# Patient Record
Sex: Male | Born: 1955
Health system: Southern US, Community
[De-identification: ages and names within clinical notes are randomized; demographics above are authoritative.]

## PROBLEM LIST (undated history)

## (undated) DIAGNOSIS — E538 Deficiency of other specified B group vitamins: Secondary | ICD-10-CM

## (undated) DIAGNOSIS — M1990 Unspecified osteoarthritis, unspecified site: Secondary | ICD-10-CM

## (undated) DIAGNOSIS — Z87442 Personal history of urinary calculi: Secondary | ICD-10-CM

## (undated) DIAGNOSIS — C801 Malignant (primary) neoplasm, unspecified: Secondary | ICD-10-CM

## (undated) DIAGNOSIS — E039 Hypothyroidism, unspecified: Secondary | ICD-10-CM

## (undated) DIAGNOSIS — E785 Hyperlipidemia, unspecified: Secondary | ICD-10-CM

## (undated) DIAGNOSIS — K5090 Crohn's disease, unspecified, without complications: Secondary | ICD-10-CM

## (undated) HISTORY — DX: Personal history of urinary calculi: Z87.442

## (undated) HISTORY — DX: Deficiency of other specified B group vitamins: E53.8

## (undated) HISTORY — DX: Crohn's disease, unspecified, without complications: K50.90

## (undated) HISTORY — PX: TONSILLECTOMY: SUR1361

---

## 1986-07-30 HISTORY — PX: SMALL INTESTINE SURGERY: SHX150

## 2002-11-16 ENCOUNTER — Encounter: Payer: Self-pay | Admitting: Orthopedic Surgery

## 2002-11-16 ENCOUNTER — Ambulatory Visit (HOSPITAL_COMMUNITY): Admission: RE | Admit: 2002-11-16 | Discharge: 2002-11-16 | Payer: Self-pay | Admitting: Orthopedic Surgery

## 2004-09-25 ENCOUNTER — Ambulatory Visit: Payer: Self-pay | Admitting: Internal Medicine

## 2004-10-02 ENCOUNTER — Ambulatory Visit (HOSPITAL_COMMUNITY): Admission: RE | Admit: 2004-10-02 | Discharge: 2004-10-02 | Payer: Self-pay | Admitting: Internal Medicine

## 2005-09-10 ENCOUNTER — Ambulatory Visit: Payer: Self-pay | Admitting: Internal Medicine

## 2006-10-03 ENCOUNTER — Ambulatory Visit (HOSPITAL_COMMUNITY): Admission: RE | Admit: 2006-10-03 | Discharge: 2006-10-03 | Payer: Self-pay | Admitting: Internal Medicine

## 2006-10-03 ENCOUNTER — Encounter (INDEPENDENT_AMBULATORY_CARE_PROVIDER_SITE_OTHER): Payer: Self-pay | Admitting: *Deleted

## 2006-10-03 ENCOUNTER — Ambulatory Visit: Payer: Self-pay | Admitting: Internal Medicine

## 2006-10-03 HISTORY — PX: COLONOSCOPY: SHX174

## 2007-07-31 HISTORY — PX: OTHER SURGICAL HISTORY: SHX169

## 2007-11-19 ENCOUNTER — Ambulatory Visit: Payer: Self-pay | Admitting: Internal Medicine

## 2008-09-01 ENCOUNTER — Ambulatory Visit: Payer: Self-pay | Admitting: Internal Medicine

## 2008-09-01 LAB — CONVERTED CEMR LAB
Basophils Absolute: 0.1 10*3/uL (ref 0.0–0.1)
Basophils Relative: 1 % (ref 0–1)
Calcium: 9.9 mg/dL (ref 8.4–10.5)
Creatinine, Ser: 1.13 mg/dL (ref 0.40–1.20)
Eosinophils Absolute: 0.1 10*3/uL (ref 0.0–0.7)
Eosinophils Relative: 1 % (ref 0–5)
HCT: 44.2 % (ref 36.0–46.0)
Lymphocytes Relative: 27 % (ref 12–46)
MCHC: 33.5 g/dL (ref 30.0–36.0)
MCV: 88.2 fL (ref 78.0–100.0)
Platelets: 269 10*3/uL (ref 150–400)
RDW: 15.7 % — ABNORMAL HIGH (ref 11.5–15.5)
Sodium: 140 meq/L (ref 135–145)
Vitamin B-12: 1216 pg/mL — ABNORMAL HIGH (ref 211–911)

## 2009-08-19 ENCOUNTER — Encounter (INDEPENDENT_AMBULATORY_CARE_PROVIDER_SITE_OTHER): Payer: Self-pay | Admitting: *Deleted

## 2009-09-30 DIAGNOSIS — G43909 Migraine, unspecified, not intractable, without status migrainosus: Secondary | ICD-10-CM | POA: Insufficient documentation

## 2009-09-30 DIAGNOSIS — K5090 Crohn's disease, unspecified, without complications: Secondary | ICD-10-CM | POA: Insufficient documentation

## 2009-10-03 ENCOUNTER — Encounter: Payer: Self-pay | Admitting: Gastroenterology

## 2009-10-03 ENCOUNTER — Ambulatory Visit: Payer: Self-pay | Admitting: Internal Medicine

## 2009-10-03 DIAGNOSIS — E638 Other specified nutritional deficiencies: Secondary | ICD-10-CM | POA: Insufficient documentation

## 2009-10-07 ENCOUNTER — Encounter: Payer: Self-pay | Admitting: Gastroenterology

## 2009-10-10 LAB — CONVERTED CEMR LAB
AST: 17 units/L (ref 0–37)
BUN: 10 mg/dL (ref 6–23)
Basophils Relative: 1 % (ref 0–1)
Calcium: 10.6 mg/dL — ABNORMAL HIGH (ref 8.4–10.5)
Chloride: 104 meq/L (ref 96–112)
Creatinine, Ser: 1.1 mg/dL (ref 0.40–1.50)
Eosinophils Absolute: 0 10*3/uL (ref 0.0–0.7)
Eosinophils Relative: 1 % (ref 0–5)
Hemoglobin: 14.7 g/dL (ref 13.0–17.0)
Lymphs Abs: 1.4 10*3/uL (ref 0.7–4.0)
MCHC: 32.1 g/dL (ref 30.0–36.0)
MCV: 92.9 fL (ref 78.0–100.0)
Platelets: 297 10*3/uL (ref 150–400)
RBC: 4.93 M/uL (ref 4.22–5.81)
RDW: 14.2 % (ref 11.5–15.5)
Vitamin B-12: 768 pg/mL (ref 211–911)

## 2009-11-17 ENCOUNTER — Encounter (INDEPENDENT_AMBULATORY_CARE_PROVIDER_SITE_OTHER): Payer: Self-pay

## 2009-12-28 ENCOUNTER — Emergency Department (HOSPITAL_COMMUNITY): Admission: EM | Admit: 2009-12-28 | Discharge: 2009-12-28 | Payer: Self-pay | Admitting: Emergency Medicine

## 2009-12-29 ENCOUNTER — Encounter: Payer: Self-pay | Admitting: Gastroenterology

## 2010-08-29 NOTE — Letter (Signed)
Summary: Recall, Labs Needed  [facility]  [address]   [place], [place] [address]   Phone: [phone]  Fax: [phone]    November 17, 2009  [facility] [address] [place], [place]  [address] May 26, 1956   Dear Ms. E[place],   Our records indicate it is time to repeat your blood work.  You can take the enclosed form to the lab on or near the date indicated.  Please make note of the new location of the lab:   [place], 2nd floor   [place] office will call you within a week to ten business days with the results.  If you do not hear from [place] in 10 business days, you should call the office.  If you have any questions regarding this, call the office at [phone], and ask for the nurse.  Labs are due on 12/01/09.   Sincerely,    Burnadette Peter LPN  [facility] Ph: [phone]   Fax: [phone]

## 2010-08-29 NOTE — Letter (Signed)
Summary: Appointment Reminder  [facility]  [address]   [place], [place] [address]   Phone: [phone]  Fax: [phone]       August 19, 2009   [place] [address] [place], [place]  [address] 10-02-1955    Dear Ms. Collinsworth,  We have been unable to reach you by phone to schedule a follow up   appointment that was recommended for you by Dr. [name]. It is very   important that we reach you to schedule an appointment. We hope that you  allow [place] to participate in your health care needs. Please contact us at  [phone] at your earliest convenience to schedule your appointment.  Sincerely,    [facility] R. Garfield Cornea, M.D.    Caro Hight, M.D. Vickey Huger, FNP-BC    Neil Crouch, PA-C Phone: [phone]    Fax: [phone]

## 2010-08-29 NOTE — Assessment & Plan Note (Signed)
Summary: YR FU/SB CROHN'S DX/SS   Visit Type:  f/u Primary Care Provider:  [facility]  Chief Complaint:  1 year follow up- doing good.  History of Present Illness: Bobby Gross is here for one year f/u of SB Crohn's disease. He also has h/o vitamin B12 deficiency s/p  prior resection over 20 years ago. He has been doing well. Recent "flare" with increased diarrhea for couple of days during death of pet. Now back at baseline, with three bm per day. No melena, brbpr, abd pain, n/v, unintentional weight loss. Appetite is good. Rare heartburn. He wants [place] to fill his cyanocobalamin.  Current Medications (verified): 1)  Multivitamins  Tabs (Multiple Vitamin) .... As Directed 2)  Pentasa 250 Mg Cr-Caps (Mesalamine) .... 4 Qid 3)  Vit B 12 Injection .... Q Month 4)  Calcium .... Once Daily  Allergies (verified): No Known Drug Allergies  Past History:  Past Medical History: TCS, 3/08, Dr. Rourk-->S/P right hemicolectomy with small bowel anastomosis, fibrotic-appearing anastomosis c/w Crohn's. Bx c/w Crohn's. Kidney Stones Small bowel Crohn's  Past Surgical History: SMALL BOWEL RESECTON AT [facility] IN 1988 Right foot surgery, plantar fascitis, 2009 Right wrist and Right shoulder surgery  Family History: Maternal grandfather, colon cancer  Social History: Environmental health specialist  Review of Systems      See HPI  Vital Signs:  Patient profile:   55 year old male Height:      72 inches Weight:      184 pounds BMI:     25.05 Temp:     98.4 degrees F oral Pulse rate:   72 / minute BP sitting:   122 / 80  (left arm) Cuff size:   regular  Vitals Entered By: Burnadette Peter LPN (October 04, 8919 1:94 AM)  Physical Exam  General:  Well developed, well nourished, no acute distress. Head:  Normocephalic and atraumatic. Eyes:  Sclera nonicteric. Mouth:  OP moist. Lungs:  Clear throughout to auscultation. Heart:  Regular rate and rhythm; no murmurs, rubs,  or bruits. Abdomen:   Bowel sounds normal.  Abdomen is soft, nontender, nondistended.  No rebound or guarding.  No hepatosplenomegaly, masses or hernias.  No abdominal bruits.  Extremities:  No clubbing, cyanosis, edema or deformities noted. Neurologic:  Alert and  oriented x4;  grossly normal neurologically. Skin:  Intact without significant lesions or rashes. Psych:  Alert and cooperative. Normal mood and affect.  Impression & Recommendations:  Problem # 1:  CROHN'S DISEASE (ICD-555.9) Clinically doing well at this time with possible mild flare recently with prolonged illness/death of pet. Due for f/u labs. Will plan on repeat bone density DEXA scan next year. OV with Dr. [name] in one year or sooner if needed. Will check with [facility] to see if Pentasa 51m is more cost-effect than the Pentasa 2531m New RX to be given for one year.  Orders: T-CBC w/Diff ([id number]-Comprehensive Metabolic Panel ([id number]-Vitamin B12 ([id number]. Patient Level II ([phone]: CYANOCOBALAMIN 1000 MCG/ML SOLN (CYANOCOBALAMIN) 100046mSC q month as directed  #10 ml x 1   Entered and Authorized by:   LesLaureen OchsewBernarda CaffeySigned by:   LesLaureen OchswBernarda Caffey 10/03/2009   Method used:   Electronically to        [facility]       [address]    [place]  [id number]  Ph: [phone]       Fax: [phone]   RxID:   [id number]

## 2010-08-29 NOTE — Miscellaneous (Signed)
Summary: Orders Update  Clinical Lists Changes  Orders: Added new Test order of T-Basic Metabolic Panel ([id number]) - Signed  

## 2010-08-29 NOTE — Medication Information (Signed)
Summary: pentasa fax form  pentasa fax form   Imported By: Burnadette Peter LPN 20/81/3887 19:59:74  _____________________________________________________________________  External Attachment:    Type:   Image     Comment:   External Document

## 2010-09-26 ENCOUNTER — Telehealth (INDEPENDENT_AMBULATORY_CARE_PROVIDER_SITE_OTHER): Payer: Self-pay | Admitting: *Deleted

## 2010-09-28 HISTORY — PX: OTHER SURGICAL HISTORY: SHX169

## 2010-10-02 ENCOUNTER — Encounter: Payer: Self-pay | Admitting: Internal Medicine

## 2010-10-02 ENCOUNTER — Other Ambulatory Visit: Payer: Self-pay | Admitting: Internal Medicine

## 2010-10-02 DIAGNOSIS — K5090 Crohn's disease, unspecified, without complications: Secondary | ICD-10-CM

## 2010-10-04 ENCOUNTER — Inpatient Hospital Stay (HOSPITAL_COMMUNITY): Admission: RE | Admit: 2010-10-04 | Payer: Self-pay | Source: Ambulatory Visit

## 2010-10-05 NOTE — Progress Notes (Addendum)
Summary: labs and dexa scan prior to March OV  Phone Note Call from Patient   Reason for Call: Talk to Nurse Summary of Call: pt called to set up his March OV with RMR and he asked if he could go ahead and get his labs and dexa scan done before his appt on 3/21. I told pt that i would let the nurse know and she can get those orders faxed over. His number to reach him is [phone] Initial call taken by: Zeb Comfort,  September 26, 2010 10:50 AM     Appended Document: labs and dexa scan prior to March OV pt was due for labs May 2011 and did not have them done. Has appt with RMR on 3/21. Do you want to order any labs on him? please advise  Appended Document: labs and dexa scan prior to March OV Pt scheduled for dexa scan 10/04/10@9 :00a.m.

## 2010-10-06 ENCOUNTER — Ambulatory Visit (HOSPITAL_COMMUNITY)
Admission: RE | Admit: 2010-10-06 | Discharge: 2010-10-06 | Disposition: A | Discharge status: Home / Self Care | Payer: 59 | Source: Ambulatory Visit | Attending: Internal Medicine | Admitting: Internal Medicine

## 2010-10-06 DIAGNOSIS — Z13820 Encounter for screening for osteoporosis: Secondary | ICD-10-CM | POA: Insufficient documentation

## 2010-10-09 ENCOUNTER — Encounter: Payer: Self-pay | Admitting: Internal Medicine

## 2010-10-10 ENCOUNTER — Encounter: Payer: Self-pay | Admitting: Internal Medicine

## 2010-10-10 NOTE — Letter (Addendum)
Summary: DEXA SCAN ORDER  DEXA SCAN ORDER   Imported By: Sofie Rower 10/02/2010 11:10:53  _____________________________________________________________________  External Attachment:    Type:   Image     Comment:   External Document  Appended Document: DEXA SCAN ORDER I tried to call pt with appt,no answer,lmom.  Appended Document: DEXA SCAN ORDER Per Bethena Roys in Radiology the pt cx his test..Marland KitchenMarland Kitchen

## 2010-10-12 ENCOUNTER — Encounter: Payer: Self-pay | Admitting: Internal Medicine

## 2010-10-12 ENCOUNTER — Telehealth (INDEPENDENT_AMBULATORY_CARE_PROVIDER_SITE_OTHER): Payer: Self-pay

## 2010-10-16 LAB — CBC
MCHC: 33.8 g/dL (ref 30.0–36.0)
MCV: 89.9 fL (ref 78.0–100.0)
Platelets: 206 10*3/uL (ref 150–400)
RDW: 14.2 % (ref 11.5–15.5)

## 2010-10-16 LAB — LIPASE, BLOOD: Lipase: 20 U/L (ref 11–59)

## 2010-10-16 LAB — URINALYSIS, ROUTINE W REFLEX MICROSCOPIC
Glucose, UA: NEGATIVE mg/dL
Leukocytes, UA: NEGATIVE
Specific Gravity, Urine: >1.03 — ABNORMAL HIGH (ref 1.005–1.030)

## 2010-10-16 LAB — COMPREHENSIVE METABOLIC PANEL
ALT: 22 U/L (ref 0–53)
AST: 25 U/L (ref 0–37)
Calcium: 8.9 mg/dL (ref 8.4–10.5)
Creatinine, Ser: 1.15 mg/dL (ref 0.4–1.5)
GFR calc Af Amer: 60 mL/min — ABNORMAL LOW (ref >60)
GFR calc non Af Amer: 50 mL/min — ABNORMAL LOW (ref >60)
Sodium: 139 mEq/L (ref 135–145)
Total Protein: 6.8 g/dL (ref 6.0–8.3)

## 2010-10-16 LAB — URINE MICROSCOPIC-ADD ON

## 2010-10-16 LAB — DIFFERENTIAL
Eosinophils Absolute: 0 10*3/uL (ref 0.0–0.7)
Eosinophils Relative: 0 % (ref 0–5)
Lymphocytes Relative: 6 % — ABNORMAL LOW (ref 12–46)
Lymphs Abs: 0.6 10*3/uL — ABNORMAL LOW (ref 0.7–4.0)
Monocytes Relative: 3 % (ref 3–12)

## 2010-10-17 NOTE — Progress Notes (Signed)
Summary: lab order  ---- Converted from flag ---- ---- 10/12/2010 1:25 PM, Bridgette Habermann MD, FACP [facility] wrote: cbc and serum creatinine  ---- 10/10/2010 11:53 AM, Burnadette Peter LPN wrote: pt has ov 3/21 with rmr. wants to do labs prior to ov. What labs would you like to order on this pt ------------------------------  Appended Document: lab order tried to call pt- LMOM  Appended Document: lab order pt aware, lab order faxed to lab

## 2010-10-18 ENCOUNTER — Ambulatory Visit (INDEPENDENT_AMBULATORY_CARE_PROVIDER_SITE_OTHER): Payer: 59 | Admitting: Internal Medicine

## 2010-10-18 ENCOUNTER — Encounter: Payer: Self-pay | Admitting: Internal Medicine

## 2010-10-18 VITALS — BP 130/90 | HR 68 | Temp 98.1°F | Ht 72.0 in | Wt 183.0 lb

## 2010-10-18 DIAGNOSIS — K5090 Crohn's disease, unspecified, without complications: Secondary | ICD-10-CM

## 2010-10-18 LAB — CONVERTED CEMR LAB
Creatinine, Ser: 1.02 mg/dL (ref 0.40–1.50)
Eosinophils Absolute: 0.1 10*3/uL (ref 0.0–0.7)
Lymphocytes Relative: 26 % (ref 12–46)
Lymphs Abs: 1.9 10*3/uL (ref 0.7–4.0)
Monocytes Relative: 8 % (ref 3–12)
Neutro Abs: 4.7 10*3/uL (ref 1.7–7.7)
Neutrophils Relative %: 65 % (ref 43–77)
Platelets: 281 10*3/uL (ref 150–400)
RBC: 4.7 M/uL (ref 4.22–5.81)
WBC: 7.2 10*3/uL (ref 4.0–10.5)

## 2010-10-18 MED ORDER — MESALAMINE ER 250 MG PO CPCR: 1000.0000 mg | ORAL_CAPSULE | Freq: Four times a day (QID) | ORAL | Status: DC

## 2010-10-18 NOTE — Patient Instructions (Signed)
Colonoscopy in one year.

## 2010-10-18 NOTE — Assessment & Plan Note (Addendum)
Patient with long-standing ileocolonic Crohn's disease doing very well on chronic mesalamine therapy. Clinically, he remains in remission. No evidence of osteopenia on recent bone density study. No need for any change in his medical regimen at this time.   Unless something comes up, we'll plan to see this nice gentleman back in the office in one year and at that time we'll  Contemplate setting up a screening colonoscopy in 2013.

## 2010-10-18 NOTE — Progress Notes (Signed)
History of present illness :  One-year followup ileocolonic Crohn's disease greater than 20 years. Has had a good year.   Symptoms overall well controlled on Pentasa. May have a flare of  Crohn's with increased abdominal pain every couple of months or so. 3 to 4 bowel movements daily  at baseline. No rectal bleeding. No nausea or vomiting.  Last colonoscopy 2008. Procedure revealed ileocolonic Crohn's adenoma or neoplasm.   Recent bone densitometry revealed no evidence of osteoporosis. He takes calcium and vitamin D daily..  Past Medical History  Diagnosis Date  . Crohn's disease     SMALL BOWEL     diagnosed 20 years ago  . Kidney stones   . History of colonoscopy  2008     ileocolonic Crohn's disease no adenoma or neoplasm    Past Surgical History  Procedure Date  . Small intestine surgery 1988    AT [facility]  . Right foot surger 2009    PLANTAR FASCITIS  . Right wrist surgery   . Right shoulder surgery     Current Outpatient Prescriptions on File Prior to Visit  Medication Sig Dispense Refill  . calcium carbonate (OS-CAL) 600 MG TABS Take 600 mg by mouth 2 (two) times daily with a meal.        . mesalamine (PENTASA) 250 MG CR capsule Take 250 mg by mouth 4 (four) times daily.        . multivitamin (THERAGRAN) per tablet Take 1 tablet by mouth daily.        . vitamin B-12 (CYANOCOBALAMIN) 1000 MCG tablet Take 1,000 mcg by mouth daily. 1000MCG [place] Q MONUTH AS DIRECTED         Allergies as of 10/18/2010  . (No Known Allergies)    Family History  Problem Relation Age of Onset  . Colon cancer      History   Social History  . Marital Status: Married    Spouse Name: N/A    Number of Children: N/A  . Years of Education: N/A   Occupational History  . Not on file.   Social History Main Topics  . Smoking status: Never Smoker   . Smokeless tobacco: Never Used  . Alcohol Use: No  . Drug Use: No  . Sexually Active: Not on file   Other Topics Concern  . Not on file    Social History Narrative  . No narrative on file   ROS:  Patient denies nausea vomiting abdominal pain hematochezia melena change in weight fever chills ; otherwise as in history of present illness  BP 130/90  Pulse 68  Temp(Src) 98.1 F (36.7 C) (Oral)  Ht 6' (1.829 m)  Wt 183 lb (83.008 kg)  BMI 24.82 kg/m2    Physical exam: Gen : Patient is alert conversant in no acute distress  HEENT :  No sclera icterus conjunctiva are pink  Chest :  Lungs are clear.  Cardiac exam regular rate and rhythm without murmur, gallop, rub  Abdomen : Nondistended positive bowel sounds soft non-tender without appreciable mass or organomegaly. Extremity exam: No lower extremity edema

## 2010-10-26 NOTE — Miscellaneous (Signed)
Summary: Orders Update  Clinical Lists Changes  Orders: Added new Test order of T-CBC w/Diff [phone]) - Signed Added new Test order of T-Creatinine Blood [id number]) - Signed

## 2010-10-26 NOTE — Letter (Signed)
Summary: DENSITOMETRY RESULTS  DENSITOMETRY RESULTS   Imported By: Hoy Morn 10/10/2010 14:46:28  _____________________________________________________________________  External Attachment:    Type:   Image     Comment:   External Document  Appended Document: DENSITOMETRY RESULTS normal bone densiometry; reommend adequate intake of vitamin d and ca; consider repeat study in 5 years

## 2010-12-12 NOTE — Assessment & Plan Note (Signed)
NAMEKAPONO, LUHN                CHART#:  [id number]   DATE:  09/01/2008                       DOB:  05/04/1956   FOLLOWUP:  Small bowel Crohn disease.  The patient was last seen on  11/19/2007.  She is out of his Pentasa and is to refill.  She has done  extremely well.  She for the most part has good days of the vast  majority of time taking Pentasa 1 g q.i.d.  She has 2-3 bowel movements  daily.  She does have some difficulty when she eats certain things.  She  is to like onions and lettuce.  She is on calcium and vitamin D  supplementation.  She is also on a B12 shot monthly and gives herself  the injection.  Ileal colonoscopy previously demonstrated some fibrosis,  ulceration of his TIA, but did really look too bad.  Bone density study  back in April 2008 demonstrated normal bone density.  Overall, she has  done very well.  Has any intercurrent illnesses or problems aside from  having plantar or fasciitis surgery over at [facility] recently.   CURRENT MEDICATIONS:  Pentasa 1 g q.i.d., multivitamin daily with  vitamin B12 injections.   ALLERGIES:  No known drug allergies.   PHYSICAL EXAMINATION:  GENERAL:  Today, she appears well.  VITAL SIGNS:  Weight 190.5, down 3 pounds, height 5 feet 11 inches,  temperature 97.9.  SKIN:  Warm and dry.  ABDOMEN:  Soft, nontender without appreciable mass or organomegaly.   ASSESSMENT:  Small bowel Crohn disease in remission, on Pentasa.  I just  had a lengthy discussion with the patient regarding the limited  scientific studies proving efficacy of therapy on Pentasa.  I again  reviewed the off label use of Pentasa for small bowel Crohn disease.  I  talked about an alternative approach including 6MP/Imuran as perhaps  therapy would more zero in on his Crohn's, however I have to keep her on  clinically.  She is doing very well.  We talked about the risk and  benefits of a Pentasa first 6MP/Imuran, the need for baseline, and more  frequent  blood work with Imuran therapy.  We mutually included this  since she was clinically doing well.  No matter what scientific studies  have shown on Pentasa.  She does not mind taking it and will continue  her on this regimen for the foreseeable future.  We will plan to check a  CBC, BMET, and a vitamin B12 level today.  I think about another bone  density study in a couple of years, but I do not think we need to do  that right now.  We will make further recommendations once I have  the lab work in hand for review, sending off a new prescription for  potassium with a year's worth of refills.       Bridgette Habermann, M.D.  Electronically Signed     RMR/MEDQ  D:  09/01/2008  T:  09/01/2008  Job:  [id number]   cc:   [place]

## 2010-12-12 NOTE — Assessment & Plan Note (Signed)
Bobby Gross, Bobby Gross                CHART#:  [id number]   DATE:  11/19/2007                       DOB:  [birth year]   Followup small bowel Crohn's disease status post ileocolonic resection  20 years ago.  He has a positive family history of colon cancer in  maternal grandfather.  Last colonoscopy is 10/03/2006.  He was found to  have a normal rectum status post right hemicolectomy, fibrotic appearing  anastomosis consistent with Crohn's.  Biopsy of this area reveal some  ulcerative fragments of acute on chronically inflamed mucosa.  He has  continued to do very well on Pentasa 1 g q.i.d.  He has 2-3 bowel  movements daily.  He was given a nonsteroidal agent for his foot 1 month  ago and passed some blood per rectum, had some abdominal cramps.  He  stopped this agent and the symptoms subsided.  He is overall doing very  well.  He lost is 54-1/2-year-old pet dog recently for which he  continues to be upset, otherwise has not had any current illnesses.  His  podiatrist is Dr. Blanch Media over in [place].  His bone density study came back  pretty much normal since his last visit.  He is on vitamin D an calcium  supplementation.  His last creatinine was a year ago and it was normal.   CURRENT MEDICATIONS:  See updated list.   ALLERGIES:  No known drug allergies.   PHYSICAL EXAMINATION:  GENERAL:  He appears well.  VITAL SIGNS:  Weight 193.  Height 5 feet 11 inches.  Temperature 98.5.  Blood pressure 142/98.  Pulse 68 (he has picked up 15 pounds since he  was last seen).  CHEST:  Lungs are clear to auscultation.  CARDIO:  Regular rate and rhythm without murmur, gallop, rub.  ABDOMEN:  Nondistended.  Positive bowel sounds.  Soft and nontender  without mass or hepatosplenomegaly.   ASSESSMENT:  Small bowel Crohn's disease remains in remission on  Pentasa.  He is to avoid nonsteroidal agents.  If he has any further  bleeding, he is to let us know.  I will plan to see him  back in 1 year.  We will  call in a refill to [place] for his  Pentasa.  We will check a serum creatinine in 1 year.       Bridgette Habermann, M.D.  Electronically Signed     RMR/MEDQ  D:  11/19/2007  T:  11/19/2007  Job:  [id number]   cc:   [employer] [facility] [employer]

## 2010-12-15 NOTE — Op Note (Signed)
NAME:  Bobby Gross, Bobby Gross               ACCOUNT NO.:  [id number]   MEDICAL RECORD NO.:  [id number]          PATIENT TYPE:  AMB   LOCATION:  DAY                           FACILITY:  [facility]   PHYSICIAN:  R. Garfield Cornea, M.D. DATE OF BIRTH:  [birth year]   DATE OF PROCEDURE:  10/03/2006  DATE OF DISCHARGE:                               OPERATIVE REPORT   INDICATIONS FOR PROCEDURE:  The patient is a 55 year old gentleman with  a 20-year history of small bowel and Crohn's disease status post  resection 20 years ago (at the time diagnosis was made) who is coming  for colonoscopy, has positive family history of colon cancer in maternal  grandfather.  He is having really no lower GI tract symptoms other than  a tendency towards occasional diarrhea.  He has done extremely well on  Pentasa 1 gram q.i.d.Marland Kitchen  Last colonoscopy was back in 2001.  Colonoscopy  is now being done.  This procedure has been discussed with the patient  at length.  Potential risks, benefits and alternatives have been  reviewed, questions answered.  He is agreeable.  Please see  documentation in medical record.   PROCEDURE NOTE:  O2 saturation, blood pressure, pulse and respirations  monitored throughout the entire procedure.  Conscious sedation with  Versed 5 mg IV and Demerol 100 grams IV in divided doses.   INSTRUMENT:  Pentax video chip system.   FINDINGS:  Digital rectal exam revealed no abnormalities.  The prep was  adequate.  Examination of colonic mucosa was undertaken from the  rectosigmoid junction where the scope was advanced through the left  transverse right colon to the level of the anastomosis of small bowel.  This was identified very clearly.  The anastomosis was fibrotic and  there was an approximately 1 cm aperture.  It would not admit the scope  into the small bowel.  Please see photos.  There was some fibrosis, no  ulceration, anastomosis looked good.  A couple of biopsies were taken.  From this level,  scope was withdrawn.  All previous mucosal surfaces  were again seen.  The residual colonic mucosa appeared normal.  Scope  was pulled down to the rectum where a thorough exam of the rectal mucosa  and retroflex view of the anal verge revealed no abnormalities.  The  patient tolerated the procedure well.   IMPRESSION:  1. Normal rectum.  2. Status post right hemicolectomy with a small bowel anastomosis,      fibrotic-appearing anastomosis consistent with Crohn's, status post      biopsy, but overall, things look really good today.   RECOMMENDATIONS:  1. Followup on path.  2. Of note, the patient has never had a bone density study.  He is at      risk for osteoporosis.  Will plan to get bone density studies in      the very near future.  Follow up on path.  3. Further recommendations to follow.      Bridgette Habermann, M.D.  Electronically Signed     RMR/MEDQ  D:  10/03/2006  T:  10/03/2006  Job:  [id number]   cc:   [place] [facility]  Fax: [phone]

## 2011-12-27 ENCOUNTER — Encounter: Payer: Self-pay | Admitting: Internal Medicine

## 2011-12-31 ENCOUNTER — Encounter: Payer: Self-pay | Admitting: Gastroenterology

## 2011-12-31 ENCOUNTER — Other Ambulatory Visit: Payer: Self-pay | Admitting: Gastroenterology

## 2011-12-31 ENCOUNTER — Ambulatory Visit (INDEPENDENT_AMBULATORY_CARE_PROVIDER_SITE_OTHER): Payer: 59 | Admitting: Gastroenterology

## 2011-12-31 VITALS — BP 137/90 | HR 70 | Temp 98.1°F | Ht 71.0 in | Wt 176.0 lb

## 2011-12-31 DIAGNOSIS — K5090 Crohn's disease, unspecified, without complications: Secondary | ICD-10-CM

## 2011-12-31 DIAGNOSIS — Z1211 Encounter for screening for malignant neoplasm of colon: Secondary | ICD-10-CM | POA: Insufficient documentation

## 2011-12-31 MED ORDER — PEG-KCL-NACL-NASULF-NA ASC-C 100 G PO SOLR: 1.0000 | Freq: Once | ORAL | Status: DC

## 2011-12-31 MED ORDER — MESALAMINE ER 250 MG PO CPCR: 1000.0000 mg | ORAL_CAPSULE | Freq: Four times a day (QID) | ORAL | Status: DC

## 2011-12-31 MED ORDER — CYANOCOBALAMIN 1000 MCG/ML IJ SOLN: 1000.0000 ug | INTRAMUSCULAR | Status: DC

## 2011-12-31 NOTE — Progress Notes (Signed)
Faxed to PCP

## 2011-12-31 NOTE — Assessment & Plan Note (Signed)
Doing very well on chronic mesalamine therapy. Clinically remains in remission. Due for colonoscopy this year given chronic Crohn's. FH of CRC in second degree relative.  I have discussed the risks, alternatives, benefits with regards to but not limited to the risk of reaction to medication, bleeding, infection, perforation and the patient is agreeable to proceed. Written consent to be obtained.  Refills given for Pentasa and B12.

## 2011-12-31 NOTE — Progress Notes (Signed)
Please request last labs from PCP.

## 2011-12-31 NOTE — Progress Notes (Signed)
Primary Care Physician:  Curlene Labrum, MD, MD  Primary Gastroenterologist:  Garfield Cornea, MD   Chief Complaint  Patient presents with  . Medication Refill    HPI:  Bobby Gross is a 56 y.o. male here for f/u small bowel Crohn's disease.  Has been doing well over the past one year. No significant flares. BM regular without melena, brbpr. No n/v. No heartburn. C/O joint issues, shoulder/elbow ("wear and tear"). Ready to schedule his surveillance colonoscopy at this time. Needs refill on B12. Denies weight loss. No major medical changes since last OV.  Current Outpatient Prescriptions  Medication Sig Dispense Refill  . calcium carbonate (OS-CAL) 600 MG TABS Take 600 mg by mouth 2 (two) times daily with a meal.        . mesalamine (PENTASA) 250 MG CR capsule Take 4 capsules (1,000 mg total) by mouth 4 (four) times daily.  1440 capsule  3  . multivitamin (THERAGRAN) per tablet Take 1 tablet by mouth daily.        . vitamin B-12 (CYANOCOBALAMIN) 1000 MCG tablet Take 1,000 mcg by mouth daily. 1000MCG [place] Q MONUTH AS DIRECTED         Allergies as of 12/31/2011  . (No Known Allergies)    Past Medical History  Diagnosis Date  . Crohn's disease     SMALL BOWEL     diagnosed 20 years ago  . Kidney stones   . B12 deficiency     s/p SB resection    Past Surgical History  Procedure Date  . Small intestine surgery 1988    AT [facility]  . Right foot surger 2009    PLANTAR FASCITIS  . Right wrist surgery   . Right shoulder surgery   . Colonoscopy 10/03/2006    Normal rectum/Status post right hemicolectomy with a small bowel anastomosis,  fibrotic-appearing anastomosis consistent with Crohn's, status post  biopsy, but overall, things look really good today    Family History  Problem Relation Age of Onset  . Colon cancer Paternal Grandfather     History   Social History  . Marital Status: Married    Spouse Name: N/A    Number of Children: N/A  . Years of Education: N/A    Occupational History  . Not on file.   Social History Main Topics  . Smoking status: Never Smoker   . Smokeless tobacco: Never Used  . Alcohol Use: No  . Drug Use: No  . Sexually Active: Not on file   Other Topics Concern  . Not on file   Social History Narrative  . No narrative on file      ROS:  General: Negative for anorexia, weight loss, fever, chills, fatigue, weakness. Eyes: Negative for vision changes.  ENT: Negative for hoarseness, difficulty swallowing , nasal congestion. CV: Negative for chest pain, angina, palpitations, dyspnea on exertion, peripheral edema.  Respiratory: Negative for dyspnea at rest, dyspnea on exertion, cough, sputum, wheezing.  GI: See history of present illness. GU:  Negative for dysuria, hematuria, urinary incontinence, urinary frequency, nocturnal urination.  MS: Negative for low back pain. See HPI. Derm: Negative for rash or itching.  Neuro: Negative for weakness, abnormal sensation, seizure, frequent headaches, memory loss, confusion.  Psych: Negative for anxiety, depression, suicidal ideation, hallucinations.  Endo: Negative for unusual weight change.  Heme: Negative for bruising or bleeding. Allergy: Negative for rash or hives.    Physical Examination:  BP 137/90  Pulse 70  Temp(Src) 98.1 F (36.7  C) (Temporal)  Ht 5' 11"  (1.803 m)  Wt 176 lb (79.833 kg)  BMI 24.55 kg/m2   General: Well-nourished, well-developed in no acute distress.  Head: Normocephalic, atraumatic.   Eyes: Conjunctiva pink, no icterus. Mouth: Oropharyngeal mucosa moist and pink , no lesions erythema or exudate. Neck: Supple without thyromegaly, masses, or lymphadenopathy.  Lungs: Clear to auscultation bilaterally.  Heart: Regular rate and rhythm, no murmurs rubs or gallops.  Abdomen: Bowel sounds are normal, nontender, nondistended, no hepatosplenomegaly or masses, no abdominal bruits or    hernia , no rebound or guarding.   Rectal: defer Extremities:  No lower extremity edema. No clubbing or deformities.  Neuro: Alert and oriented x 4 , grossly normal neurologically.  Skin: Warm and dry, no rash or jaundice.   Psych: Alert and cooperative, normal mood and affect.

## 2012-01-17 ENCOUNTER — Encounter (HOSPITAL_COMMUNITY): Payer: Self-pay | Admitting: Pharmacy Technician

## 2012-01-28 ENCOUNTER — Encounter (HOSPITAL_COMMUNITY): Payer: Self-pay | Admitting: *Deleted

## 2012-01-28 ENCOUNTER — Ambulatory Visit (HOSPITAL_COMMUNITY)
Admission: RE | Admit: 2012-01-28 | Discharge: 2012-01-28 | Disposition: A | Discharge status: Home / Self Care | Payer: 59 | Source: Ambulatory Visit | Attending: Internal Medicine | Admitting: Internal Medicine

## 2012-01-28 ENCOUNTER — Encounter (HOSPITAL_COMMUNITY)
Admission: RE | Disposition: A | Discharge status: Home / Self Care | Payer: Self-pay | Source: Ambulatory Visit | Attending: Internal Medicine

## 2012-01-28 DIAGNOSIS — Z9049 Acquired absence of other specified parts of digestive tract: Secondary | ICD-10-CM

## 2012-01-28 DIAGNOSIS — Z1211 Encounter for screening for malignant neoplasm of colon: Secondary | ICD-10-CM

## 2012-01-28 DIAGNOSIS — K5090 Crohn's disease, unspecified, without complications: Secondary | ICD-10-CM

## 2012-01-28 HISTORY — PX: COLONOSCOPY: SHX5424

## 2012-01-28 SURGERY — COLONOSCOPY
Anesthesia: Moderate Sedation

## 2012-01-28 MED ORDER — STERILE WATER FOR IRRIGATION IR SOLN
Status: DC | PRN
  Administered 2012-01-28: 11:00:00

## 2012-01-28 MED ORDER — MIDAZOLAM HCL 5 MG/5ML IJ SOLN
INTRAMUSCULAR | Status: DC | PRN
  Administered 2012-01-28 (×2): 2 mg via INTRAVENOUS
  Administered 2012-01-28: 1 mg via INTRAVENOUS

## 2012-01-28 MED ORDER — MIDAZOLAM HCL 5 MG/5ML IJ SOLN
INTRAMUSCULAR | Status: AC
  Filled 2012-01-28: qty 10

## 2012-01-28 MED ORDER — MEPERIDINE HCL 100 MG/ML IJ SOLN
INTRAMUSCULAR | Status: DC | PRN
  Administered 2012-01-28 (×2): 50 mg via INTRAVENOUS

## 2012-01-28 MED ORDER — SODIUM CHLORIDE 0.45 % IV SOLN
Freq: Once | INTRAVENOUS | Status: AC
  Administered 2012-01-28: 09:00:00 via INTRAVENOUS

## 2012-01-28 MED ORDER — MEPERIDINE HCL 100 MG/ML IJ SOLN
INTRAMUSCULAR | Status: AC
  Filled 2012-01-28: qty 2

## 2012-01-28 NOTE — Discharge Instructions (Addendum)
  Colonoscopy Discharge Instructions  Read the instructions outlined below and refer to this sheet in the next few weeks. These discharge instructions provide you with general information on caring for yourself after you leave the hospital. Your doctor may also give you specific instructions. While your treatment has been planned according to the most current medical practices available, unavoidable complications occasionally occur. If you have any problems or questions after discharge, call Dr. [name] at [phone]. ACTIVITY  You may resume your regular activity, but move at a slower pace for the next 24 hours.   Take frequent rest periods for the next 24 hours.   Walking will help get rid of the air and reduce the bloated feeling in your belly (abdomen).   No driving for 24 hours (because of the medicine (anesthesia) used during the test).    Do not sign any important legal documents or operate any machinery for 24 hours (because of the anesthesia used during the test).  NUTRITION  Drink plenty of fluids.   You may resume your normal diet as instructed by your doctor.   Begin with a light meal and progress to your normal diet. Heavy or fried foods are harder to digest and may make you feel sick to your stomach (nauseated).   Avoid alcoholic beverages for 24 hours or as instructed.  MEDICATIONS  You may resume your normal medications unless your doctor tells you otherwise.  WHAT YOU CAN EXPECT TODAY  Some feelings of bloating in the abdomen.   Passage of more gas than usual.   Spotting of blood in your stool or on the toilet paper.  IF YOU HAD POLYPS REMOVED DURING THE COLONOSCOPY:  No aspirin products for 7 days or as instructed.   No alcohol for 7 days or as instructed.   Eat a soft diet for the next 24 hours.  FINDING OUT THE RESULTS OF YOUR TEST Not all test results are available during your visit. If your test results are not back during the visit, make an appointment  with your caregiver to find out the results. Do not assume everything is normal if you have not heard from your caregiver or the medical facility. It is important for you to follow up on all of your test results.  SEEK IMMEDIATE MEDICAL ATTENTION IF:  You have more than a spotting of blood in your stool.   Your belly is swollen (abdominal distention).   You are nauseated or vomiting.   You have a temperature over 101.   You have abdominal pain or discomfort that is severe or gets worse throughout the day.    Further recommendations to follow pending review of pathology

## 2012-01-28 NOTE — H&P (View-Only) (Signed)
Primary Care Physician:  Curlene Labrum, MD, MD  Primary Gastroenterologist:  Garfield Cornea, MD   Chief Complaint  Patient presents with  . Medication Refill    HPI:  Bobby Gross is a 56 y.o. male here for f/u small bowel Crohn's disease.  Has been doing well over the past one year. No significant flares. BM regular without melena, brbpr. No n/v. No heartburn. C/O joint issues, shoulder/elbow ("wear and tear"). Ready to schedule his surveillance colonoscopy at this time. Needs refill on B12. Denies weight loss. No major medical changes since last OV.  Current Outpatient Prescriptions  Medication Sig Dispense Refill  . calcium carbonate (OS-CAL) 600 MG TABS Take 600 mg by mouth 2 (two) times daily with a meal.        . mesalamine (PENTASA) 250 MG CR capsule Take 4 capsules (1,000 mg total) by mouth 4 (four) times daily.  1440 capsule  3  . multivitamin (THERAGRAN) per tablet Take 1 tablet by mouth daily.        . vitamin B-12 (CYANOCOBALAMIN) 1000 MCG tablet Take 1,000 mcg by mouth daily. 1000MCG [place] Q [place] AS DIRECTED         Allergies as of 12/31/2011  . (No Known Allergies)    Past Medical History  Diagnosis Date  . Crohn's disease     SMALL BOWEL     diagnosed 20 years ago  . Kidney stones   . B12 deficiency     s/p SB resection    Past Surgical History  Procedure Date  . Small intestine surgery 1988    AT [facility]  . Right foot surger 2009    PLANTAR FASCITIS  . Right wrist surgery   . Right shoulder surgery   . Colonoscopy 10/03/2006    Normal rectum/Status post right hemicolectomy with a small bowel anastomosis,  fibrotic-appearing anastomosis consistent with Crohn's, status post  biopsy, but overall, things look really good today    Family History  Problem Relation Age of Onset  . Colon cancer Paternal Grandfather     History   Social History  . Marital Status: Married    Spouse Name: N/A    Number of Children: N/A  . Years of Education: N/A    Occupational History  . Not on file.   Social History Main Topics  . Smoking status: Never Smoker   . Smokeless tobacco: Never Used  . Alcohol Use: No  . Drug Use: No  . Sexually Active: Not on file   Other Topics Concern  . Not on file   Social History Narrative  . No narrative on file      ROS:  General: Negative for anorexia, weight loss, fever, chills, fatigue, weakness. Eyes: Negative for vision changes.  ENT: Negative for hoarseness, difficulty swallowing , nasal congestion. CV: Negative for chest pain, angina, palpitations, dyspnea on exertion, peripheral edema.  Respiratory: Negative for dyspnea at rest, dyspnea on exertion, cough, sputum, wheezing.  GI: See history of present illness. GU:  Negative for dysuria, hematuria, urinary incontinence, urinary frequency, nocturnal urination.  MS: Negative for low back pain. See HPI. Derm: Negative for rash or itching.  Neuro: Negative for weakness, abnormal sensation, seizure, frequent headaches, memory loss, confusion.  Psych: Negative for anxiety, depression, suicidal ideation, hallucinations.  Endo: Negative for unusual weight change.  Heme: Negative for bruising or bleeding. Allergy: Negative for rash or hives.    Physical Examination:  BP 137/90  Pulse 70  Temp(Src) 98.1 F (36.7  C) (Temporal)  Ht 5' 11"  (1.803 m)  Wt 176 lb (79.833 kg)  BMI 24.55 kg/m2   General: Well-nourished, well-developed in no acute distress.  Head: Normocephalic, atraumatic.   Eyes: Conjunctiva pink, no icterus. Mouth: Oropharyngeal mucosa moist and pink , no lesions erythema or exudate. Neck: Supple without thyromegaly, masses, or lymphadenopathy.  Lungs: Clear to auscultation bilaterally.  Heart: Regular rate and rhythm, no murmurs rubs or gallops.  Abdomen: Bowel sounds are normal, nontender, nondistended, no hepatosplenomegaly or masses, no abdominal bruits or    hernia , no rebound or guarding.   Rectal: defer Extremities:  No lower extremity edema. No clubbing or deformities.  Neuro: Alert and oriented x 4 , grossly normal neurologically.  Skin: Warm and dry, no rash or jaundice.   Psych: Alert and cooperative, normal mood and affect.

## 2012-01-28 NOTE — Interval H&P Note (Signed)
History and Physical Interval Note:  01/28/2012 10:33 AM  Bobby Gross  has presented today for surgery, with the diagnosis of Crohn's  The various methods of treatment have been discussed with the patient and family. After consideration of risks, benefits and other options for treatment, the patient has consented to  Procedure(s) (LRB): COLONOSCOPY (N/A) as a surgical intervention .  The patient's history has been reviewed, patient examined, no change in status, stable for surgery.  I have reviewed the patients' chart and labs.  Questions were answered to the patient's satisfaction.     Manus Rudd

## 2012-01-28 NOTE — Op Note (Signed)
[facility] [address] [place], [place]  [address]  COLONOSCOPY PROCEDURE REPORT  PATIENT:  Bobby Gross, Bobby Gross  MR#:  [id number] BIRTHDATE:  1955-12-27, 55 yrs. old  GENDER:  male ENDOSCOPIST:  R. Garfield Cornea, MD FACP [facility] REF. BY:  Judd Lien, M.D. PROCEDURE DATE:  01/28/2012 PROCEDURE:  Screening colonoscopy; history of Crohn's disease  INDICATIONS:  Long-standing ileocolonic Crohn's disease; status post distant right hemicolectomy  INFORMED CONSENT:  The risks, benefits, alternatives and imponderables including but not limited to bleeding, perforation as well as the possibility of a missed lesion have been reviewed. The potential for biopsy, lesion removal, etc. have also been discussed.  Questions have been answered.  All parties agreeable. Please see the history and physical in the medical record for more information.  MEDICATIONS:  Versed 5 mg IV and Demerol 100 mg IV in divided doses.  DESCRIPTION OF PROCEDURE:  After a digital rectal exam was performed, the EC-[id number] ([id number]) colonoscope was advanced from the anus through the rectum and colon to the area of the small bowel. These structures were well-seen and photographed for the record.  From this level, the scope was slowly and cautiously withdrawn.  The mucosal surfaces were carefully surveyed utilizing scope tip deflection to facilitate fold flattening as needed.  The scope was pulled down into the rectum where a thorough examination including retroflexion was performed. <<PROCEDUREIMAGES>>  FINDINGS:           Adequate preparation. Normal rectum. Status post right hemicolectomy. Anastomosis with small bowel identified. A 4- 5 mm aperture to the anastmosis  with overlying fibrotic changes. I could not intubate the distal neoterminal ileum but it could be seen from a distance. The remainder of the residual colonic mucosa of normal.  THERAPEUTIC / DIAGNOSTIC MANEUVERS PERFORMED:  Biopsies of  the distal ileal mucosa were taken for histologic study. Also, segmental  biopsies in the residual colon were taken. The rectum was also biopsied.  COMPLICATIONS:  None  CECAL WITHDRAWAL TIME: 10 minutes  IMPRESSION:  Status post right hemicolectomy for ileocolonic Crohn's disease. Disease process appears fairly inactive at this time endoscopically.                Status post segmental biopsy.  Further recommendations to follow pending review of pathology  RECOMMENDATIONS:   Continue Pentasa. Followup on pathology.  ______________________________ R. Garfield Cornea, MD [facility]  CC:  Judd Lien, M.D.  n. eSIGNED:   R. Garfield Cornea at 01/28/2012 11:18 AM  Damaris Hippo, [phone]

## 2012-01-28 NOTE — Interval H&P Note (Signed)
History and Physical Interval Note:  01/28/2012 11:23 AM  Bobby Gross  has presented today for surgery, with the diagnosis of Crohn's  The various methods of treatment have been discussed with the patient and family. After consideration of risks, benefits and other options for treatment, the patient has consented to  Procedure(s) (LRB): COLONOSCOPY (N/A) as a surgical intervention .  The patient's history has been reviewed, patient examined, no change in status, stable for surgery.  I have reviewed the patients' chart and labs.  Questions were answered to the patient's satisfaction.     Manus Rudd

## 2012-01-28 NOTE — H&P (View-Only) (Signed)
Please request last labs from PCP.

## 2012-01-30 ENCOUNTER — Encounter (HOSPITAL_COMMUNITY): Payer: Self-pay | Admitting: Internal Medicine

## 2012-02-02 ENCOUNTER — Encounter: Payer: Self-pay | Admitting: Internal Medicine

## 2012-02-14 NOTE — Progress Notes (Signed)
Bone density scan done 09/2010. Normal.

## 2012-02-14 NOTE — Progress Notes (Signed)
Would consider DEXA SCAN. DISCUSS WITH RMR.  REVIEWED.

## 2012-02-16 ENCOUNTER — Emergency Department (HOSPITAL_COMMUNITY)
Admission: EM | Admit: 2012-02-16 | Discharge: 2012-02-16 | Disposition: A | Discharge status: Home / Self Care | Payer: 59 | Attending: Emergency Medicine | Admitting: Emergency Medicine

## 2012-02-16 ENCOUNTER — Emergency Department (HOSPITAL_COMMUNITY): Payer: 59

## 2012-02-16 ENCOUNTER — Encounter (HOSPITAL_COMMUNITY): Payer: Self-pay | Admitting: *Deleted

## 2012-02-16 DIAGNOSIS — N200 Calculus of kidney: Secondary | ICD-10-CM | POA: Insufficient documentation

## 2012-02-16 DIAGNOSIS — K5090 Crohn's disease, unspecified, without complications: Secondary | ICD-10-CM | POA: Insufficient documentation

## 2012-02-16 DIAGNOSIS — Z79899 Other long term (current) drug therapy: Secondary | ICD-10-CM | POA: Insufficient documentation

## 2012-02-16 LAB — URINALYSIS, ROUTINE W REFLEX MICROSCOPIC
Bilirubin Urine: NEGATIVE
Nitrite: NEGATIVE
Specific Gravity, Urine: >1.03 — ABNORMAL HIGH (ref 1.005–1.030)
pH: 6 (ref 5.0–8.0)

## 2012-02-16 LAB — BASIC METABOLIC PANEL
BUN: 8 mg/dL (ref 6–23)
Chloride: 102 mEq/L (ref 96–112)
GFR calc Af Amer: 86 mL/min — ABNORMAL LOW (ref >90)
Potassium: 4 mEq/L (ref 3.5–5.1)

## 2012-02-16 LAB — URINE MICROSCOPIC-ADD ON

## 2012-02-16 MED ORDER — ONDANSETRON HCL 4 MG PO TABS
4.0000 mg | ORAL_TABLET | Freq: Three times a day (TID) | ORAL | Status: DC | PRN
Start: 2012-02-16 — End: 2012-02-16

## 2012-02-16 MED ORDER — HYDROMORPHONE HCL PF 1 MG/ML IJ SOLN
INTRAMUSCULAR | Status: AC
  Administered 2012-02-16: 1 mg via INTRAVENOUS
  Filled 2012-02-16: qty 1

## 2012-02-16 MED ORDER — ONDANSETRON HCL 8 MG PO TABS: 8.0000 mg | ORAL_TABLET | Freq: Three times a day (TID) | ORAL | Status: AC | PRN

## 2012-02-16 MED ORDER — KETOROLAC TROMETHAMINE 30 MG/ML IJ SOLN
30.0000 mg | Freq: Once | INTRAMUSCULAR | Status: AC
  Administered 2012-02-16: 30 mg via INTRAVENOUS
  Filled 2012-02-16: qty 1

## 2012-02-16 MED ORDER — ONDANSETRON HCL 4 MG/2ML IJ SOLN
4.0000 mg | Freq: Once | INTRAMUSCULAR | Status: AC
  Administered 2012-02-16: 4 mg via INTRAVENOUS
  Filled 2012-02-16: qty 2

## 2012-02-16 MED ORDER — HYDROMORPHONE HCL PF 1 MG/ML IJ SOLN
1.0000 mg | Freq: Once | INTRAMUSCULAR | Status: AC
  Administered 2012-02-16: 1 mg via INTRAVENOUS
  Filled 2012-02-16: qty 1

## 2012-02-16 MED ORDER — CEPHALEXIN 500 MG PO CAPS: 1000.0000 mg | ORAL_CAPSULE | Freq: Two times a day (BID) | ORAL | Status: AC

## 2012-02-16 MED ORDER — OXYCODONE-ACETAMINOPHEN 5-325 MG PO TABS: 1.0000 | ORAL_TABLET | ORAL | Status: AC | PRN

## 2012-02-16 MED ORDER — TAMSULOSIN HCL 0.4 MG PO CAPS: 0.4000 mg | ORAL_CAPSULE | Freq: Every day | ORAL | Status: DC

## 2012-02-16 MED ORDER — OXYCODONE-ACETAMINOPHEN 5-325 MG PO TABS: 1.0000 | ORAL_TABLET | ORAL | Status: DC | PRN

## 2012-02-16 NOTE — ED Notes (Signed)
Patient with no complaints at this time. Respirations even and unlabored. Skin warm/dry. Discharge instructions reviewed with patient at this time. Patient given opportunity to voice concerns/ask questions. IV removed per policy and band-aid applied to site. Patient discharged at this time and left Emergency Department with steady gait.  

## 2012-02-16 NOTE — ED Notes (Signed)
Pt c/o right flank pain x 1 hour and hematuria. History of kidney stones. Pt c/o vomiting x 1.

## 2012-02-16 NOTE — ED Provider Notes (Signed)
History     CSN: [id number]  Arrival date & time 02/16/12  0911   First MD Initiated Contact with Patient 02/16/12 [phone]      Chief Complaint  Patient presents with  . Flank Pain    (Consider location/radiation/quality/duration/timing/severity/associated sxs/prior treatment) HPI Comments: Bobby Gross presents with a one hour history of sudden onset of right sided flank pain with hematuria and dysuria.  He has vomited times one as well and continues to have nausea at this time.  He denies fevers or chills.  He does have a history of kidney stones the last episode occurring approximately 2 years ago.  Pain is constant and sharp and he is unable to obtain relief with any positional changes.  He has had no treatment prior to arrival.  Past medical history significant for kidney stones and Crohn's disease which she states has been stable.  The history is provided by the patient and the spouse.    Past Medical History  Diagnosis Date  . Crohn's disease     SMALL BOWEL     diagnosed 20 years ago  . Kidney stones   . B12 deficiency     s/p SB resection    Past Surgical History  Procedure Date  . Small intestine surgery 1988    AT [facility]  . Right foot surger 2009    PLANTAR FASCITIS  . Right wrist surgery   . Right shoulder surgery   . Colonoscopy 10/03/2006    Normal rectum/Status post right hemicolectomy with a small bowel anastomosis,  fibrotic-appearing anastomosis consistent with Crohn's, status post  biopsy, but overall, things look really good today  . Colonoscopy 01/28/2012    Procedure: COLONOSCOPY;  Surgeon: [name], MD;  Location: [facility];  Service: Endoscopy;  Laterality: N/A;  with Terminal Ileoscopy/9:45    Family History  Problem Relation Age of Onset  . Colon cancer Other     History  Substance Use Topics  . Smoking status: Never Smoker   . Smokeless tobacco: Never Used  . Alcohol Use: No      Review of Systems  Constitutional: Negative for  fever.  HENT: Negative for congestion, sore throat and neck pain.   Eyes: Negative.   Respiratory: Negative for chest tightness and shortness of breath.   Cardiovascular: Negative for chest pain.  Gastrointestinal: Positive for nausea, vomiting and abdominal pain.  Genitourinary: Negative.   Musculoskeletal: Negative for joint swelling and arthralgias.  Skin: Negative.  Negative for rash and wound.  Neurological: Negative for dizziness, weakness, light-headedness, numbness and headaches.  Hematological: Negative.   Psychiatric/Behavioral: Negative.     Allergies  Review of patient's allergies indicates no known allergies.  Home Medications   Current Outpatient Rx  Name Route Sig Dispense Refill  . AMOXICILLIN-POT CLAVULANATE 875-125 MG PO TABS Oral Take 1 tablet by mouth 2 (two) times daily. FOR 10 DAYS, USE FOR GLAND FLARE UPS  AS DIRECTED    . CALCIUM CARBONATE-VITAMIN D 500-200 MG-UNIT PO TABS Oral Take 1 tablet by mouth every other day.    . CYANOCOBALAMIN 1000 MCG/ML IJ SOLN Subcutaneous Inject 1 mL (1,000 mcg total) into the skin every 30 (thirty) days. 10 mL 1  . GLUCOSAMINE-CHONDROITIN 500-400 MG PO TABS Oral Take 1 tablet by mouth every morning.    Marland Kitchen MESALAMINE ER 250 MG PO CPCR Oral Take 4 capsules (1,000 mg total) by mouth 4 (four) times daily. 1440 capsule 3  . METHYLPREDNISOLONE 4 MG PO KIT  Oral Take by mouth as needed. follow package directions FOR GLAND FLARE UPS AS DIRECTED    . ADULT MULTIVITAMIN W/MINERALS CH Oral Take 1 tablet by mouth daily.    . CEPHALEXIN 500 MG PO CAPS Oral Take 2 capsules (1,000 mg total) by mouth 2 (two) times daily. 20 capsule 0  . ONDANSETRON HCL 8 MG PO TABS Oral Take 1 tablet (8 mg total) by mouth every 8 (eight) hours as needed for nausea. 12 tablet 0  . OXYCODONE-ACETAMINOPHEN 5-325 MG PO TABS Oral Take 1 tablet by mouth every 4 (four) hours as needed for pain. 20 tablet 0  . PEG-KCL-NACL-NASULF-NA ASC-C 100 G PO SOLR Oral Take 1 kit  (100 g total) by mouth once. As directed Please purchase 1 Fleets enema to use with the prep 1 kit 0  . TAMSULOSIN HCL 0.4 MG PO CAPS Oral Take 1 capsule (0.4 mg total) by mouth daily after supper. 10 capsule 0    BP 138/83  Pulse 75  Resp 19  Ht 6' (1.829 m)  Wt 170 lb (77.111 kg)  BMI 23.06 kg/m2  SpO2 99%  Physical Exam  Nursing note and vitals reviewed. Constitutional: He appears well-developed and well-nourished.  HENT:  Head: Normocephalic and atraumatic.  Eyes: Conjunctivae are normal.  Neck: Normal range of motion.  Cardiovascular: Normal rate, regular rhythm, normal heart sounds and intact distal pulses.   Pulmonary/Chest: Effort normal and breath sounds normal. He has no wheezes.  Abdominal: Soft. Bowel sounds are normal. He exhibits no distension. There is no tenderness. There is CVA tenderness. There is no rebound and no guarding.  Musculoskeletal: Normal range of motion.  Neurological: He is alert.  Skin: Skin is warm and dry.  Psychiatric: He has a normal mood and affect.    ED Course  Procedures (including critical care time)  Labs Reviewed  URINALYSIS, ROUTINE W REFLEX MICROSCOPIC - Abnormal; Notable for the following:    Specific Gravity, Urine >1.030 (*)     Hgb urine dipstick LARGE (*)     All other components within normal limits  BASIC METABOLIC PANEL - Abnormal; Notable for the following:    Glucose, Bld 120 (*)     GFR calc non Af Amer 74 (*)     GFR calc Af Amer 86 (*)     All other components within normal limits  URINE MICROSCOPIC-ADD ON - Abnormal; Notable for the following:    Bacteria, UA FEW (*)     Crystals CA OXALATE CRYSTALS (*)     All other components within normal limits  URINE CULTURE   Ct Abdomen Pelvis Wo Contrast  02/16/2012  *RADIOLOGY REPORT*  Clinical Data: Right-sided flank pain.  CT ABDOMEN AND PELVIS WITHOUT CONTRAST  Technique:  Multidetector CT imaging of the abdomen and pelvis was performed following the standard  protocol without intravenous contrast.  Comparison: CT of abdomen and pelvis 12/28/2009.  Findings:  Lung Bases: 6 mm nodule in the right middle lobe (image three of series three) is unchanged in retrospect compared to prior study 12/28/2009 and can be considered radiographically benign requiring no further imaging follow-up.  A small amount of scarring in the medial segment of the right middle lobe.  Otherwise, unremarkable.  Abdomen/Pelvis:  Image 74 of series 2 demonstrates a 4 mm right ureterovesicular junction [place] with mild proximal hydroureteronephrosis and perinephric stranding, consistent with mild obstruction.  No additional calculi are noted within the collecting system of either kidney, along the course of either  ureter, or within the lumen of the urinary bladder.  The unenhanced appearance of the liver, gallbladder, pancreas, spleen and bilateral adrenal glands is unremarkable.  Postoperative changes of appendectomy and potential resection of the terminal ileum are noted.  No ascites or pneumoperitoneum and no pathologic distension of bowel.  No definite pathologic lymphadenopathy identified within the abdomen or pelvis.  Prostate and urinary bladder are unremarkable in appearance.  Musculoskeletal: There are no aggressive appearing lytic or blastic lesions noted in the visualized portions of the skeleton.  IMPRESSION: 1.  4 mm mildly obstructive calculus at the right ureterovesicular junction with only mild proximal right hydroureteronephrosis and perinephric stranding in this time. 2.  No other acute findings in the abdomen or pelvis. 3.  Status post appendectomy and probable resection of the terminal ileum.  Original Report Authenticated By: Etheleen Mayhew, M.D.     1. Kidney Genson    Pt given diaudid 1 mg IV x 2 before obtained pain relief.  Zofran given with no emesis in ed.     MDM  Urine culture sent,  Pt will be covered for infection with keflex.  Prescribed percocet,  Zofran,   Also prescribed flomax qhs.  Pt to f/u with either Dr Michela Pitcher or [facility] - both numbers given.  Pt works in [employer] and may prefer to be seen there.  Strict return instructions given if pt develops fevers, worse pain or uncontrolled vomiting.        Evalee Jefferson, [place] 02/16/12 1149

## 2012-02-16 NOTE — ED Notes (Signed)
Pt still grimacing and restless. PA aware and vo one mg dilaudid iv to be given. Read back and verified. Vs obtained prior to giving

## 2012-02-17 LAB — URINE CULTURE
Culture: NO GROWTH
Special Requests: NORMAL

## 2012-02-17 NOTE — ED Provider Notes (Signed)
Medical screening examination/treatment/procedure(s) were performed by non-physician practitioner and as supervising physician I was immediately available for consultation/collaboration.   Alfonzo Feller, DO 02/17/12 [phone]

## 2012-02-19 NOTE — Progress Notes (Signed)
Never received labs requested from PCP. Please request again.

## 2012-02-19 NOTE — Progress Notes (Signed)
Called PCP, Dr Remo Lipps [facility]'s office, they have no lab results on this pt for over the past year.  Can you be more specific on what labs we are looking for and I will expand my search. Thanks.

## 2012-09-05 ENCOUNTER — Ambulatory Visit (INDEPENDENT_AMBULATORY_CARE_PROVIDER_SITE_OTHER): Payer: 59 | Admitting: Internal Medicine

## 2012-09-05 ENCOUNTER — Encounter: Payer: Self-pay | Admitting: Internal Medicine

## 2012-09-05 VITALS — BP 154/87 | HR 76 | Temp 98.2°F | Ht 72.0 in | Wt 185.2 lb

## 2012-09-05 DIAGNOSIS — K5090 Crohn's disease, unspecified, without complications: Secondary | ICD-10-CM

## 2012-09-05 NOTE — Patient Instructions (Signed)
Continue Pentasa and Vitamin B12  Office visit here in 1 year

## 2012-09-05 NOTE — Progress Notes (Signed)
Primary Care Physician:  Curlene Labrum, MD Primary Gastroenterologist:  Dr. [name]  Pre-Procedure History & Physical: HPI:  Bobby Gross is a 57 y.o. male here for followup of ileal Crohn's disease.  Doing well no bowel pain nausea vomiting or diarrhea. DEXA scan -2 years ago. He is doing very well on off label Pentasa 1 g 4 times daily. The report appear may dictate changing in the something else if the co-pay he becomes exorbitant in the near future. He continues taking calcium and vitamin B12 supplementation. Renal function normal last year. He did have a stenotic fibrotic ileocolonic anastomosis. However, mucosal biopsies negative for active disease.  He is having problems with his left foot due to plantar fasciitis. He is in a foot splint. He just had an injection. Past Medical History  Diagnosis Date  . Crohn's disease     SMALL BOWEL     diagnosed 20 years ago  . Kidney stones   . B12 deficiency     s/p SB resection    Past Surgical History  Procedure Date  . Small intestine surgery 1988    AT [facility]  . Right foot surger 2009    PLANTAR FASCITIS  . Right wrist surgery   . Right shoulder surgery   . Colonoscopy 10/03/2006    Normal rectum/Status post right hemicolectomy with a small bowel anastomosis,  fibrotic-appearing anastomosis consistent with Crohn's, status post  biopsy, but overall, things look really good today  . Colonoscopy 01/28/2012    Dr. Sabino Gasser R hemicolectomy for ileocolonic crohn's disease. disease appears inactive at this time.  all Bx's benign.  . Dexa scan 09/2010    normal    Prior to Admission medications   Medication Sig Start Date End Date Taking? Authorizing Provider  cyanocobalamin (,VITAMIN B-12,) 1000 MCG/ML injection Inject 1 mL (1,000 mcg total) into the skin every 30 (thirty) days. 12/31/11  Yes Mahala Menghini, PA  mesalamine (PENTASA) 250 MG CR capsule Take 4 capsules (1,000 mg total) by mouth 4 (four) times daily. 12/31/11  Yes Mahala Menghini, PA   Multiple Vitamin (MULTIVITAMIN WITH MINERALS) TABS Take 1 tablet by mouth daily.   Yes Historical Provider, MD  amoxicillin-clavulanate (AUGMENTIN) 875-125 MG per tablet Take 1 tablet by mouth 2 (two) times daily. FOR 10 DAYS, USE FOR GLAND FLARE UPS  AS DIRECTED    Historical Provider, MD  calcium-vitamin D (OSCAL WITH D) 500-200 MG-UNIT per tablet Take 1 tablet by mouth every other day.    Historical Provider, MD  glucosamine-chondroitin 500-400 MG tablet Take 1 tablet by mouth every morning.    Historical Provider, MD  methylPREDNISolone (MEDROL DOSEPAK) 4 MG tablet Take by mouth as needed. follow package directions FOR GLAND FLARE UPS AS DIRECTED    Historical Provider, MD  peg 3350 powder (MOVIPREP) 100 G SOLR Take 1 kit (100 g total) by mouth once. As directed Please purchase 1 Fleets enema to use with the prep 12/31/11   [name], MD  Tamsulosin HCl (FLOMAX) 0.4 MG CAPS Take 1 capsule (0.4 mg total) by mouth daily after supper. 02/16/12   Evalee Jefferson, PA    Allergies as of 09/05/2012  . (No Known Allergies)    Family History  Problem Relation Age of Onset  . Colon cancer Other     History   Social History  . Marital Status: Married    Spouse Name: N/A    Number of Children: N/A  . Years of Education: N/A  Occupational History  . Not on file.   Social History Main Topics  . Smoking status: Never Smoker   . Smokeless tobacco: Never Used  . Alcohol Use: No  . Drug Use: No  . Sexually Active: Not on file   Other Topics Concern  . Not on file   Social History Narrative  . No narrative on file    Review of Systems: See HPI, otherwise negative ROS  Physical Exam: BP 154/87  Pulse 76  Temp 98.2 F (36.8 C) (Oral)  Ht 6' (1.829 m)  Wt 185 lb 3.2 oz (84.006 kg)  BMI 25.12 kg/m2 General:   Alert,  Well-developed, well-nourished, pleasant and cooperative in NAD Skin:  Intact without significant lesions or rashes. Eyes:  Sclera clear, no icterus.   Conjunctiva  pink. Ears:  Normal auditory acuity. Nose:  No deformity, discharge,  or lesions. Mouth:  No deformity or lesions. Neck:  Supple; no masses or thyromegaly. No significant cervical adenopathy. Lungs:  Clear throughout to auscultation.   No wheezes, crackles, or rhonchi. No acute distress. Heart:  Regular rate and rhythm; no murmurs, clicks, rubs,  or gallops. Abdomen: Well-healed infraumbilical surgical scar. Non-distended, normal bowel sounds.  Soft and nontender without appreciable mass or hepatosplenomegaly.  Pulses:  Normal pulses noted. Extremities:  Without clubbing or edema.  Impression/Plan:  Small bowel Crohn's disease in a deep clinical remission based on negative biopsies. He does have a fibro-stenosing component, however. I discussed,again the off label treatment of Crohn's with Pentasa  With which he has really done well with for the better part of the last 20 years. Renal function normal. He may not be able to afford Pentasa in the future. If this becomes case, would continue consider transitioning him to azathioprine or 6-MP. I have discussed this approach with them in some detail along with the risks and benefits. I would not advocate that he come off therapy altogether even though he is doing well due to the fibro-stenosing component of his disease.  Recommendations: Continue Pentasa 4 g daily for the time being. Continue vitamin B12. Continue calcium supplementation. Unless something comes up, we'll see this nice gentleman back in one year.

## 2012-09-13 ENCOUNTER — Other Ambulatory Visit: Payer: Self-pay

## 2012-09-29 ENCOUNTER — Telehealth: Payer: Self-pay

## 2012-09-29 NOTE — Telephone Encounter (Signed)
Pt called- he has been giving himself his B12 injections and gave his last one yesterday. [facility] has B12 on back order and is not sure when they will get it again.   Pt is requesting a written rx so he can call around to see if he can find it and then he will take the rx in to be filled.    Also pt needs a new rx [facility] for his pentasa.

## 2012-09-30 MED ORDER — MESALAMINE ER 250 MG PO CPCR: 1000.0000 mg | ORAL_CAPSULE | Freq: Four times a day (QID) | ORAL | Status: DC

## 2012-09-30 MED ORDER — CYANOCOBALAMIN 1000 MCG/ML IJ SOLN: 1000.0000 ug | INTRAMUSCULAR | Status: DC

## 2012-09-30 NOTE — Telephone Encounter (Signed)
Pt aware, B12 rx mailed to pt.  pentasa was sent to [facility]. Please send to [facility] mail order . Thanks.

## 2012-09-30 NOTE — Telephone Encounter (Signed)
Rx for pentasa sent B-12 inj Rx ready

## 2012-10-14 ENCOUNTER — Telehealth: Payer: Self-pay | Admitting: Internal Medicine

## 2012-10-14 NOTE — Telephone Encounter (Signed)
Appeal letter.  Can we first find out what the insurance does cover?

## 2012-10-14 NOTE — Telephone Encounter (Signed)
Pt was seen by RMR on 2/7 and called today to say that his insurance is not covering his pentaza prescription. It went from a 3 month supply for $100 to 3 month supply to over $3000. He has an OV to come back to speak with RMR about this because he wants to appeal this. He has been taking this medications since the early 90's. Patient is aware that we have samples up front to hold him over until his OV. He is asking to get something documented why he is taking this medication and wants to send it in with his appeal

## 2012-10-14 NOTE — Telephone Encounter (Signed)
#  12 boxes of pentasa given. Pt was just seen in 08/2012. He is due to return for 1 year. Do we need to bring him in or can we just do an appeal letter and send it to the pt?

## 2012-10-15 NOTE — Telephone Encounter (Signed)
Tried to call pt- LMOM 

## 2012-10-15 NOTE — Telephone Encounter (Signed)
Spoke with pt- informed him that we would just do appeal letter and fax it to [facility]. Copy of letter from [facility] with preferred drugs given to AS for review.   Manuela Schwartz, please cancel pts appt with RMR. He is aware that we are going to cancel it.

## 2012-10-16 ENCOUNTER — Encounter: Payer: Self-pay | Admitting: Gastroenterology

## 2012-10-16 NOTE — Progress Notes (Unsigned)
Insurance does not want to cover Pentasa. They are recommending Colazal, sulfasalazine as low cost options, Apriso or Lialda as mid-range cost options.   Dr. [name] had documented previously on an insurance statement that he would recommend Lialda if Pentasa not covered. Avoid sulfasalazine.   I will complete an appeal letter for patient.  However, if not approved, we will need to go with Lialda, Apriso, or Colazal.

## 2012-10-21 ENCOUNTER — Encounter: Payer: Self-pay | Admitting: Gastroenterology

## 2012-10-21 NOTE — Progress Notes (Signed)
AS completed letter and pt is aware and will come by today to pick it up.

## 2012-10-24 ENCOUNTER — Telehealth: Payer: Self-pay

## 2012-10-24 NOTE — Telephone Encounter (Signed)
Message copied by Claudina Lick on Fri Oct 24, 2012 11:48 AM ------      Message from: [name]      Created: Wed Oct 22, 2012  8:02 AM       I'm happy to provide him with a prescription and he can see and it to the pharmacy of his choice      ----- Message -----         From: Marylou Mccoy, LPN         Sent: 5/94/5859   4:05 PM           To: [name], MD            pts insurance still is not wanting to pay for his pentasa. Vicente Males has done an appeal letter and the pt is doing an appeal letter but he wants to know if this doesn't work out,and they still refuse to pay for it,  what do you think about him getting pentasa from [place]? He said it comes in 590m tablets instead of capsules. I told pt I would ask and let him know.        ------

## 2012-10-24 NOTE — Telephone Encounter (Signed)
Pt is aware and will call us if the appeal doesn't go thru with the insurance and he needs the written rx.

## 2012-11-04 ENCOUNTER — Ambulatory Visit: Payer: 59 | Admitting: Internal Medicine

## 2012-11-25 ENCOUNTER — Telehealth: Payer: Self-pay

## 2012-11-25 NOTE — Telephone Encounter (Signed)
Pt called- he continues to fight with his insurance company to get them to pay for the pentasa that he has been taking for 22 years and doing well on. He spoke with his HR rep and the last thing that they recommend we try is a provider peer to peer review. Pt is asking if you will call [facility] and have a peer to peer review in the hopes that they will finally approve his medicine. Their phone number is [phone]. Please advise if this is something we can do.

## 2012-12-03 NOTE — Telephone Encounter (Signed)
Pt is aware the his insurance will not cover the Pentasa. I told him that we was going to to some samples of the Lialda up front for him to try before we send in a Rx to the drug store. He will come by to pick them up and call up in a week to let us know if it is working.

## 2012-12-03 NOTE — Telephone Encounter (Signed)
I contacted patient's insurance. Unfortunately, Pentasa is a plan exclusion. There is no way to do a peer to peer review, as it is not covered, period. This is extremely frustrating on his behalf, and I understand that.  However, we can try samples of Lialda instead of having him fill a prescription first. Please let him know that we will provide him samples to see how he does. The other option would be Apriso. These are both tier 2.   Please provide prescription for Lialdia 1.2 grams, directions: take 4 capsules daily. Let's see how he does with this.   Again, tell him I am sorry we could not have a better outcome, but we will work with him to find the best regimen.

## 2012-12-15 ENCOUNTER — Telehealth: Payer: Self-pay | Admitting: Internal Medicine

## 2012-12-15 MED ORDER — MESALAMINE 1.2 G PO TBEC: DELAYED_RELEASE_TABLET | ORAL | Status: DC

## 2012-12-15 NOTE — Telephone Encounter (Signed)
Patient states he is doing well on the [place] and we can go ahead and call Rx in he uses [facility] he cant bring form until tomorrow

## 2012-12-15 NOTE — Telephone Encounter (Signed)
Routing to refill box. Pt needs Lialda rx.

## 2012-12-15 NOTE — Addendum Note (Signed)
Addended by: Orvil Feil on: 12/15/2012 04:54 PM   Modules accepted: Orders

## 2012-12-15 NOTE — Telephone Encounter (Signed)
Done

## 2013-04-23 ENCOUNTER — Other Ambulatory Visit: Payer: Self-pay

## 2013-04-23 MED ORDER — CYANOCOBALAMIN 1000 MCG/ML IJ SOLN: 1000.0000 ug | INTRAMUSCULAR | Status: DC

## 2013-06-04 ENCOUNTER — Other Ambulatory Visit: Payer: Self-pay

## 2013-07-03 ENCOUNTER — Other Ambulatory Visit: Payer: Self-pay | Admitting: Gastroenterology

## 2014-04-22 ENCOUNTER — Encounter: Payer: Self-pay | Admitting: Internal Medicine

## 2014-04-22 ENCOUNTER — Other Ambulatory Visit: Payer: Self-pay | Admitting: Gastroenterology

## 2014-04-22 NOTE — Telephone Encounter (Signed)
APPT MADE AND LETTER SENT  °

## 2014-04-22 NOTE — Telephone Encounter (Signed)
Patient is overdue for OV. Please schedule with RMR only.

## 2014-05-21 ENCOUNTER — Encounter: Payer: Self-pay | Admitting: Internal Medicine

## 2014-05-21 ENCOUNTER — Ambulatory Visit (INDEPENDENT_AMBULATORY_CARE_PROVIDER_SITE_OTHER): Payer: 59 | Admitting: Internal Medicine

## 2014-05-21 VITALS — BP 123/79 | HR 65 | Temp 97.0°F | Ht 72.0 in | Wt 177.4 lb

## 2014-05-21 DIAGNOSIS — K50919 Crohn's disease, unspecified, with unspecified complications: Secondary | ICD-10-CM

## 2014-05-21 LAB — BASIC METABOLIC PANEL
BUN: 13 mg/dL (ref 6–23)
CHLORIDE: 104 meq/L (ref 96–112)
CO2: 25 meq/L (ref 19–32)
Calcium: 9.4 mg/dL (ref 8.4–10.5)
Creat: 1.11 mg/dL (ref 0.50–1.35)
Glucose, Bld: 82 mg/dL (ref 70–99)
POTASSIUM: 4.3 meq/L (ref 3.5–5.3)
SODIUM: 138 meq/L (ref 135–145)

## 2014-05-21 MED ORDER — CYANOCOBALAMIN 1000 MCG/ML IJ SOLN: 1000.0000 ug | Freq: Once | INTRAMUSCULAR | Status: DC

## 2014-05-21 NOTE — Progress Notes (Signed)
Primary Care Physician:  Curlene Labrum, MD Primary Gastroenterologist:  Dr. [name]  Pre-Procedure History & Physical: HPI:  Bobby Gross is a 58 y.o. male here for followup of ileocolonic Crohn's. Has done well on off label mesalamine. Insurance constraints dictated change from Pentasa to Lialda. He is actually doing very well ; not have any symptoms of abdominal pain, nausea, diarrhea or bleeding. Last colonoscopy 2013 demonstrated some fibro-stenosing disease; biopsies negative for active ileitis. He's taking B12 injections monthly. He just had his right rotator cuff repaired.  Past Medical History  Diagnosis Date  . Crohn's disease     SMALL BOWEL     diagnosed 20 years ago  . Kidney stones   . B12 deficiency     s/p SB resection    Past Surgical History  Procedure Laterality Date  . Small intestine surgery  1988    AT [facility]  . Right foot surger  2009    PLANTAR FASCITIS  . Right wrist surgery    . Right shoulder surgery    . Colonoscopy  10/03/2006    Normal rectum/Status post right hemicolectomy with a small bowel anastomosis,  fibrotic-appearing anastomosis consistent with Crohn's, status post  biopsy, but overall, things look really good today  . Colonoscopy  01/28/2012    Dr. Sabino Gasser R hemicolectomy for ileocolonic crohn's disease. disease appears inactive at this time.  all Bx's benign.  . Dexa scan  09/2010    normal    Prior to Admission medications   Medication Sig Start Date End Date Taking? Authorizing Provider  calcium-vitamin D (OSCAL WITH D) 500-200 MG-UNIT per tablet Take 1 tablet by mouth every other day.   Yes Historical Provider, MD  cyanocobalamin (,VITAMIN B-12,) 1000 MCG/ML injection INJECT 1 ML INTO THE SKIN EVERY 30 DAYS 04/22/14  Yes Mahala Menghini, PA-C  HYDROcodone-acetaminophen (NORCO) 10-325 MG per tablet Take 1 tablet by mouth every 6 (six) hours as needed.   Yes Historical Provider, MD  LIALDA 1.2 G EC tablet Take 4 tablets by mouth  daily  07/03/13  Yes Mahala Menghini, PA-C  Multiple Vitamin (MULTIVITAMIN WITH MINERALS) TABS Take 1 tablet by mouth daily.   Yes Historical Provider, MD  amoxicillin-clavulanate (AUGMENTIN) 875-125 MG per tablet Take 1 tablet by mouth 2 (two) times daily. FOR 10 DAYS, USE FOR GLAND FLARE UPS  AS DIRECTED    Historical Provider, MD  glucosamine-chondroitin 500-400 MG tablet Take 1 tablet by mouth every morning.    Historical Provider, MD  methylPREDNISolone (MEDROL DOSEPAK) 4 MG tablet Take by mouth as needed. follow package directions FOR GLAND FLARE UPS AS DIRECTED    Historical Provider, MD  peg 3350 powder (MOVIPREP) 100 G SOLR Take 1 kit (100 g total) by mouth once. As directed Please purchase 1 Fleets enema to use with the prep 12/31/11   [name], MD  Tamsulosin HCl (FLOMAX) 0.4 MG CAPS Take 1 capsule (0.4 mg total) by mouth daily after supper. 02/16/12   Evalee Jefferson, PA-C    Allergies as of 05/21/2014  . (No Known Allergies)    Family History  Problem Relation Age of Onset  . Colon cancer Other     History   Social History  . Marital Status: Married    Spouse Name: N/A    Number of Children: N/A  . Years of Education: N/A   Occupational History  . Not on file.   Social History Main Topics  . Smoking status:  Never Smoker   . Smokeless tobacco: Never Used     Comment: Never smoker  . Alcohol Use: No  . Drug Use: No  . Sexual Activity: Not on file   Other Topics Concern  . Not on file   Social History Narrative  . No narrative on file    Review of Systems: See HPI, otherwise negative ROS  Physical Exam: BP 123/79  Pulse 65  Temp(Src) 97 F (36.1 C) (Oral)  Ht 6' (1.829 m)  Wt 177 lb 6.4 oz (80.468 kg)  BMI 24.05 kg/m2 General:   Alert,  Well-developed, well-nourished, pleasant and cooperative in NAD. Right shoulder in a sling Skin:  Intact without significant lesions or rashes. Eyes:  Sclera clear, no icterus.   Conjunctiva pink. Ears:  Normal auditory  acuity. Nose:  No deformity, discharge,  or lesions. Mouth:  No deformity or lesions. Neck:  Supple; no masses or thyromegaly. No significant cervical adenopathy. Lungs:  Clear throughout to auscultation.   No wheezes, crackles, or rhonchi. No acute distress. Heart:  Regular rate and rhythm; no murmurs, clicks, rubs,  or gallops. Abdomen: Non-distended, Well healed surgical scar, normal bowel sounds.  Soft and nontender without appreciable mass or hepatosplenomegaly.  Pulses:  Normal pulses noted. Extremities:  Without clubbing or edema.  Impression:  Ileocolonic Crohn's disease in remission. Colonoscopy 2 years ago reassuring. He's not had a serum creatinine in about 2 years. Clinically and histologically has been doing very well. He has a deep remission.  Recommendations:  Continue Lialda daily (off label use discussed)  Continue monthly B12 injections (will refill x 18 months)  Basic metabolic profile today   Further recommendations to follow  Office visit 18 months      Notice: This dictation was prepared with Dragon dictation along with smaller phrase technology. Any transcriptional errors that result from this process are unintentional and may not be corrected upon review.

## 2014-05-21 NOTE — Patient Instructions (Signed)
Continue Lialda daily (off label use discussed)  Continue monthly B12 injections (will refill x 18 months)  Basic metabolic profile today   Further recommendations to follow  Office visit 18 months

## 2014-06-18 ENCOUNTER — Other Ambulatory Visit: Payer: Self-pay | Admitting: Gastroenterology

## 2015-04-29 ENCOUNTER — Other Ambulatory Visit: Payer: Self-pay | Admitting: Gastroenterology

## 2015-05-04 ENCOUNTER — Other Ambulatory Visit: Payer: Self-pay

## 2015-10-25 ENCOUNTER — Encounter: Payer: Self-pay | Admitting: Internal Medicine

## 2015-12-06 ENCOUNTER — Other Ambulatory Visit: Payer: Self-pay

## 2015-12-06 ENCOUNTER — Encounter: Payer: Self-pay | Admitting: Internal Medicine

## 2015-12-06 ENCOUNTER — Ambulatory Visit (INDEPENDENT_AMBULATORY_CARE_PROVIDER_SITE_OTHER): Payer: 59 | Admitting: Internal Medicine

## 2015-12-06 VITALS — BP 140/85 | HR 68 | Temp 98.3°F | Ht 71.0 in | Wt 189.0 lb

## 2015-12-06 DIAGNOSIS — K5080 Crohn's disease of both small and large intestine without complications: Secondary | ICD-10-CM

## 2015-12-06 DIAGNOSIS — E638 Other specified nutritional deficiencies: Secondary | ICD-10-CM

## 2015-12-06 NOTE — Patient Instructions (Addendum)
Continue lialda daily  We will retrieve recent labs from Dr. Pleas Koch  Try Vitamin B12 injections ever other month  We will schedule a bone density study  Office visit in 1 year to set up a surveillance colonoscopy

## 2015-12-06 NOTE — Progress Notes (Signed)
Primary Care Physician:  Curlene Labrum, MD Primary Gastroenterologist:  Dr. [name]  Pre-Procedure History & Physical: HPI:  Bobby Gross is a 60 y.o. male here for follow-up of ileocolonic Crohn's disease. Status post right hemicolectomy and ileal resection back in the late 1980s for Crohn's. Crohn's disease x30 years. Has done very well on off label Lialda. Recent labs through Dr. Lizbeth Bark office reportedly okay except a high B12. Receives monthly B12 injections. Patient denies abdominal pain, nausea, vomiting or diarrhea. No melena or rectal bleeding. Last colonoscopy 2013 - mild fibro-stenosing disease at the anastomosis. Grandfather with colon cancer; Due for surveillance colonoscopy in 2018. Creatinine from 2 years ago normal. Repeat lab done recently.  DEXA scan 5 years ago also normal.  Past Medical History  Diagnosis Date  . Crohn's disease ([place])     SMALL BOWEL     diagnosed 20 years ago  . Kidney stones   . B12 deficiency     s/p SB resection    Past Surgical History  Procedure Laterality Date  . Small intestine surgery  1988    AT [facility]  . Right foot surger  2009    PLANTAR FASCITIS  . Right wrist surgery    . Right shoulder surgery    . Colonoscopy  10/03/2006    Normal rectum/Status post right hemicolectomy with a small bowel anastomosis,  fibrotic-appearing anastomosis consistent with Crohn's, status post  biopsy, but overall, things look really good today  . Colonoscopy  01/28/2012    Dr. Sabino Gasser R hemicolectomy for ileocolonic crohn's disease. disease appears inactive at this time.  all Bx's benign.  . Dexa scan  09/2010    normal    Prior to Admission medications   Medication Sig Start Date End Date Taking? Authorizing Provider  cyanocobalamin (,VITAMIN B-12,) 1000 MCG/ML injection INJECT 1 ML INTO THE SKIN EVERY 30 DAYS 04/22/14  Yes Mahala Menghini, PA-C  cyanocobalamin (,VITAMIN B-12,) 1000 MCG/ML injection Inject 1 mL (1,000 mcg total) into the muscle  once. 05/21/14  Yes [name], MD  LIALDA 1.2 G EC tablet Take 4 tablets by mouth  daily 05/05/15  Yes Carlis Stable, NP  Multiple Vitamin (MULTIVITAMIN WITH MINERALS) TABS Take 1 tablet by mouth daily.   Yes Historical Provider, MD  amoxicillin-clavulanate (AUGMENTIN) 875-125 MG per tablet Take 1 tablet by mouth 2 (two) times daily. Reported on 12/06/2015    Historical Provider, MD  calcium-vitamin D (OSCAL WITH D) 500-200 MG-UNIT per tablet Take 1 tablet by mouth every other day. Reported on 12/06/2015    Historical Provider, MD  glucosamine-chondroitin 500-400 MG tablet Take 1 tablet by mouth every morning. Reported on 12/06/2015    Historical Provider, MD  HYDROcodone-acetaminophen (NORCO) 10-325 MG per tablet Take 1 tablet by mouth every 6 (six) hours as needed. Reported on 12/06/2015    Historical Provider, MD  methylPREDNISolone (MEDROL DOSEPAK) 4 MG tablet Take by mouth as needed. Reported on 12/06/2015    Historical Provider, MD  peg 3350 powder (MOVIPREP) 100 G SOLR Take 1 kit (100 g total) by mouth once. As directed Please purchase 1 Fleets enema to use with the prep Patient not taking: Reported on 12/06/2015 12/31/11   [name], MD  Tamsulosin HCl (FLOMAX) 0.4 MG CAPS Take 1 capsule (0.4 mg total) by mouth daily after supper. Patient not taking: Reported on 12/06/2015 02/16/12   Evalee Jefferson, PA-C    Allergies as of 12/06/2015  . (No Known Allergies)  Family History  Problem Relation Age of Onset  . Colon cancer Other     Social History   Social History  . Marital Status: Married    Spouse Name: N/A  . Number of Children: N/A  . Years of Education: N/A   Occupational History  . Not on file.   Social History Main Topics  . Smoking status: Never Smoker   . Smokeless tobacco: Never Used     Comment: Never smoker  . Alcohol Use: No  . Drug Use: No  . Sexual Activity: Not on file   Other Topics Concern  . Not on file   Social History Narrative    Review of Systems: See  HPI, otherwise negative ROS  Physical Exam: BP 140/85 mmHg  Pulse 68  Temp(Src) 98.3 F (36.8 C)  Ht 5' 11"  (1.803 m)  Wt 189 lb (85.73 kg)  BMI 26.37 kg/m2 General:   Alert,  Well-developed, well-nourished, pleasant and cooperative in NAD Skin:  Intact without significant lesions or rashes. Eyes:  Sclera clear, no icterus.   Conjunctiva pink. Neck:  Supple; no masses or thyromegaly. No significant cervical adenopathy. Lungs:  Clear throughout to auscultation.   No wheezes, crackles, or rhonchi. No acute distress. Heart:  Regular rate and rhythm; no murmurs, clicks, rubs,  or gallops. Abdomen: nondistended. Well-healed laparotomy scar. Positive bowel sounds, and non-tender without appreciable mass or organomegaly  Pulses:  Normal pulses noted. Extremities:  Without clubbing or edema.  Impression:  Ileocolonic Crohn's disease in remission on off label Lialda. He has done remarkably well over the years. Last colonoscopy 4 years ago demonstrated mild fibro-stenosing disease. B12 level reportedly high- likely related to timing of B12 injections. Bone density study normal 5 years ago to  Recommendations:    Continue lialda daily  We will retrieve recent labs from Dr. Pleas Koch ,in particular, want to see a normal serum creatinine.  Try Vitamin B12 injections ever other month  We will schedule a bone density study  Office visit in 1 year to set up a surveillance colonoscopy     Notice: This dictation was prepared with Dragon dictation along with smaller phrase technology. Any transcriptional errors that result from this process are unintentional and may not be corrected upon review.

## 2015-12-09 ENCOUNTER — Telehealth: Payer: Self-pay | Admitting: Internal Medicine

## 2015-12-09 ENCOUNTER — Encounter: Payer: Self-pay | Admitting: Internal Medicine

## 2015-12-09 ENCOUNTER — Ambulatory Visit (HOSPITAL_COMMUNITY)
Admission: RE | Admit: 2015-12-09 | Discharge: 2015-12-09 | Disposition: A | Discharge status: Home / Self Care | Payer: 59 | Source: Ambulatory Visit | Attending: Internal Medicine | Admitting: Internal Medicine

## 2015-12-09 DIAGNOSIS — E638 Other specified nutritional deficiencies: Secondary | ICD-10-CM | POA: Diagnosis not present

## 2015-12-09 NOTE — Progress Notes (Signed)
Patient ID: Bobby Gross, male   DOB: Sep 05, 1955, 60 y.o.   MRN: [id number] Received labs from PCP office labs were done on March 3 of this year. Creatinine okay at 1.17. I note that his H&H is 12.2 and 38.0 borderline low MCV 78. These were done over 2 months ago. TSH slightly elevated at 7.2. I recommend have another CBC done at this time. He may or may not need iron studies.  Let's get a CBC.

## 2015-12-12 NOTE — Telephone Encounter (Signed)
y

## 2015-12-13 NOTE — Progress Notes (Signed)
Tried to call pt- NA- LMOM 

## 2015-12-13 NOTE — Progress Notes (Signed)
(  Pt also has bone density results)

## 2015-12-19 ENCOUNTER — Other Ambulatory Visit: Payer: Self-pay

## 2015-12-19 ENCOUNTER — Other Ambulatory Visit: Payer: Self-pay | Admitting: Internal Medicine

## 2015-12-19 DIAGNOSIS — D649 Anemia, unspecified: Secondary | ICD-10-CM

## 2015-12-19 NOTE — Progress Notes (Signed)
Tried to call pt- NA-LMOM with results and recommendations. Lab order done, sent to lab and mailed a copy to the pt.

## 2016-01-10 ENCOUNTER — Other Ambulatory Visit: Payer: Self-pay | Admitting: Nurse Practitioner

## 2016-02-16 ENCOUNTER — Other Ambulatory Visit: Payer: Self-pay | Admitting: Internal Medicine

## 2016-08-06 DIAGNOSIS — L570 Actinic keratosis: Secondary | ICD-10-CM | POA: Diagnosis not present

## 2016-08-06 DIAGNOSIS — D235 Other benign neoplasm of skin of trunk: Secondary | ICD-10-CM | POA: Diagnosis not present

## 2016-08-06 DIAGNOSIS — D1800 Hemangioma unspecified site: Secondary | ICD-10-CM | POA: Diagnosis not present

## 2016-09-28 ENCOUNTER — Other Ambulatory Visit: Payer: Self-pay | Admitting: Nurse Practitioner

## 2016-10-15 DIAGNOSIS — Z0001 Encounter for general adult medical examination with abnormal findings: Secondary | ICD-10-CM | POA: Diagnosis not present

## 2016-10-30 DIAGNOSIS — E039 Hypothyroidism, unspecified: Secondary | ICD-10-CM | POA: Diagnosis not present

## 2016-10-30 DIAGNOSIS — D519 Vitamin B12 deficiency anemia, unspecified: Secondary | ICD-10-CM | POA: Diagnosis not present

## 2016-10-30 DIAGNOSIS — M1712 Unilateral primary osteoarthritis, left knee: Secondary | ICD-10-CM | POA: Diagnosis not present

## 2016-10-30 DIAGNOSIS — Z0001 Encounter for general adult medical examination with abnormal findings: Secondary | ICD-10-CM | POA: Diagnosis not present

## 2016-10-30 DIAGNOSIS — M1711 Unilateral primary osteoarthritis, right knee: Secondary | ICD-10-CM | POA: Diagnosis not present

## 2016-11-01 ENCOUNTER — Encounter: Payer: Self-pay | Admitting: Internal Medicine

## 2016-12-11 ENCOUNTER — Other Ambulatory Visit: Payer: Self-pay

## 2016-12-11 ENCOUNTER — Ambulatory Visit (INDEPENDENT_AMBULATORY_CARE_PROVIDER_SITE_OTHER): Payer: 59 | Admitting: Internal Medicine

## 2016-12-11 ENCOUNTER — Telehealth: Payer: Self-pay

## 2016-12-11 ENCOUNTER — Encounter: Payer: Self-pay | Admitting: Internal Medicine

## 2016-12-11 VITALS — BP 143/90 | HR 70 | Temp 97.8°F | Ht 72.0 in | Wt 180.4 lb

## 2016-12-11 DIAGNOSIS — K50919 Crohn's disease, unspecified, with unspecified complications: Secondary | ICD-10-CM

## 2016-12-11 DIAGNOSIS — K5090 Crohn's disease, unspecified, without complications: Secondary | ICD-10-CM

## 2016-12-11 MED ORDER — CYANOCOBALAMIN 1000 MCG/ML IJ SOLN: 1000.0000 ug | INTRAMUSCULAR | 11 refills | Status: DC

## 2016-12-11 MED ORDER — MESALAMINE 1.2 G PO TBEC: 4.8000 g | DELAYED_RELEASE_TABLET | Freq: Every day | ORAL | 3 refills | Status: DC

## 2016-12-11 NOTE — Telephone Encounter (Signed)
Tried to call pt to schedule colonoscopy, LMOVM for him to call office.

## 2016-12-11 NOTE — Progress Notes (Signed)
Primary Care Physician:  Curlene Labrum, MD Primary Gastroenterologist:  Dr.   Pre-Procedure History & Physical: HPI:  Bobby Gross is a 61 y.o. male here for ileocolonic Crohn's disease. Has done with long term with surgical remission, status post right hemicolectomy at [facility]. One to 2 bowel movements daily. No abdominal pain,  Nausea or vomiting. Normal DEXA 2013; due for repeat soon. Due for surveillance  colonoscopy now.  Previously high B12 level with every other month now back to once daily recent labs in March demonstrated a B12 level 442. TSH recently elevated in the 12 range; his thyroid supplement has been increased. Vitamin D hydroxy 25 hydroxy  -  slightly low at 29.6;  he continues on vitamin D calcium supplementation;  kidney function is good;  AP  is noted to be slightly elevated at 124; transaminases and bilirubin normal. He recently had both knees injected with steroids due to symptomatic osteoarthritis.  Past Medical History:  Diagnosis Date  . B12 deficiency    s/p SB resection  . Crohn's disease ([place])    SMALL BOWEL     diagnosed 20 years ago  . Kidney stones     Past Surgical History:  Procedure Laterality Date  . COLONOSCOPY  10/03/2006   Normal rectum/Status post right hemicolectomy with a small bowel anastomosis,  fibrotic-appearing anastomosis consistent with Crohn's, status post  biopsy, but overall, things look really good today  . COLONOSCOPY  01/28/2012   Dr. Sabino Gasser R hemicolectomy for ileocolonic crohn's disease. disease appears inactive at this time.  all Bx's benign.  Marland Kitchen dexa scan  09/2010   normal  . RIGHT FOOT SURGER  2009   PLANTAR FASCITIS  . RIGHT SHOULDER SURGERY    . RIGHT WRIST SURGERY    . [place]    Prior to Admission medications   Medication Sig Start Date End Date Taking? Authorizing Provider  calcium-vitamin D (OSCAL WITH D) 500-200 MG-UNIT per tablet Take 1 tablet by mouth every other day.  Reported on 12/06/2015   Yes [provider]  cyanocobalamin (,VITAMIN B-12,) 1000 MCG/ML injection Inject 1 mL (1,000 mcg total) into the muscle once. Inject every other month 02/17/16  Yes Carlis Stable, NP  levothyroxine (SYNTHROID, LEVOTHROID) 50 MCG tablet Take 25 mcg by mouth daily before breakfast.   Yes [provider]  LIALDA 1.2 g EC tablet TAKE 4 TABLETS BY MOUTH  DAILY 09/28/16  Yes Mahala Menghini, PA-C  Multiple Vitamin (MULTIVITAMIN WITH MINERALS) TABS Take 1 tablet by mouth daily.   Yes [provider]  amoxicillin-clavulanate (AUGMENTIN) 875-125 MG per tablet Take 1 tablet by mouth 2 (two) times daily. Reported on 12/06/2015    [provider]  glucosamine-chondroitin 500-400 MG tablet Take 1 tablet by mouth every morning. Reported on 12/06/2015    [provider]  HYDROcodone-acetaminophen (NORCO) 10-325 MG per tablet Take 1 tablet by mouth every 6 (six) hours as needed. Reported on 12/06/2015    [provider]  methylPREDNISolone (MEDROL DOSEPAK) 4 MG tablet Take by mouth as needed. Reported on 12/06/2015    [provider]  peg 3350 powder (MOVIPREP) 100 G SOLR Take 1 kit (100 g total) by mouth once. As directed Please purchase 1 Fleets enema to use with the prep Patient not taking: Reported on 12/06/2015 12/31/11   , [name], MD  Tamsulosin HCl (FLOMAX) 0.4 MG CAPS Take 1 capsule (0.4 mg total)  by mouth daily after supper. Patient not taking: Reported on 12/06/2015 02/16/12   Evalee Jefferson, PA-C    Allergies as of 12/11/2016  . (No Known Allergies)    Family History  Problem Relation Age of Onset  . Colon cancer Other     Social History   Social History  . Marital status: Married    Spouse name: N/A  . Number of children: N/A  . Years of education: N/A   Occupational History  . Not on file.   Social History Main Topics  . Smoking status: Never Smoker  . Smokeless tobacco: Never Used     Comment: Never smoker    . Alcohol use No  . Drug use: No  . Sexual activity: Not on file   Other Topics Concern  . Not on file   Social History Narrative  . No narrative on file    Review of Systems: See HPI, otherwise negative ROS  Physical Exam: BP (!) 143/90   Pulse 70   Temp 97.8 F (36.6 C) (Oral)   Ht 6' (1.829 m)   Wt 180 lb 6.4 oz (81.8 kg)   BMI 24.47 kg/m  General:   Alert,  Well-developed, well-nourished, pleasant and cooperative in NAD Neck:  Supple; no masses or thyromegaly. No significant cervical adenopathy. Lungs:  Clear throughout to auscultation.   No wheezes, crackles, or rhonchi. No acute distress. Heart:  Regular rate and rhythm; no murmurs, clicks, rubs,  or gallops. Abdomen: Non-distended, normal bowel sounds.  Well-healed midline surgical scar. Soft and nontender without appreciable mass or hepatosplenomegaly.  Pulses:  Normal pulses noted. Extremities:  Without clubbing or edema.  Impression:  Pleasant 61 year old gentleman with ileocolonic Crohn's disease. Clinically, in remission. Vitamin D level slightly low. B12 level lower limit of normal. He is on supplementation for both. Significant to hypothyroidism now on an escalated dose of thyroxine. Mildly elevated alkaline phosphatase nonspecific. He's had no PSC clinically. This needs to be simply rechecked. He'll have another DEXA scan this year.  He is due for surveillance colonoscopy at this time.  Recommendations:  Schedule a surveillance colonoscopy - hx of Crohns - conscious sedation.  The risks, benefits, limitations, alternatives and imponderables have been reviewed with the patient. Questions have been answered. All parties are agreeable.   Hepatic profile now  Continue B12 injection monthly  Continue Lialda (off label as discussed)  Bone density study later this year  Continue VIT D/ Calcium supplement  Further recommendations to follow   Notice: This dictation was prepared with Dragon dictation along  with smaller phrase technology. Any transcriptional errors that result from this process are unintentional and may not be corrected upon review.

## 2016-12-11 NOTE — Patient Instructions (Signed)
Schedule a surveillance colonoscopy - hx of Crohns - conscious sedation  Hepatic profile now  Continue B12 injection monthly  Continue Lialda  Bone density study later this year  Continue VIT D/ Calcium supplement  Further recommendations to follow

## 2016-12-12 ENCOUNTER — Other Ambulatory Visit: Payer: Self-pay

## 2016-12-12 ENCOUNTER — Telehealth: Payer: Self-pay

## 2016-12-12 DIAGNOSIS — Z8719 Personal history of other diseases of the digestive system: Secondary | ICD-10-CM

## 2016-12-12 MED ORDER — NA SULFATE-K SULFATE-MG SULF 17.5-3.13-1.6 GM/177ML PO SOLN: 1.0000 | ORAL | 0 refills | Status: DC

## 2016-12-12 NOTE — Telephone Encounter (Signed)
Called pt to schedule colonoscopy with RMR. Procedure scheduled for 01/31/17 at 7:30am (pt requested early morning as possible). Rx for prep sent to [facility]. Orders entered. Instructions mailed to pt.

## 2016-12-12 NOTE — Telephone Encounter (Signed)
See other phone note. Procedure scheduled.

## 2016-12-12 NOTE — Telephone Encounter (Signed)
PA info for colonoscopy submitted via [facility] website. Case approved. PA# [id number].

## 2017-01-09 DIAGNOSIS — E039 Hypothyroidism, unspecified: Secondary | ICD-10-CM | POA: Diagnosis not present

## 2017-01-14 DIAGNOSIS — M1712 Unilateral primary osteoarthritis, left knee: Secondary | ICD-10-CM | POA: Diagnosis not present

## 2017-01-14 DIAGNOSIS — E039 Hypothyroidism, unspecified: Secondary | ICD-10-CM | POA: Diagnosis not present

## 2017-01-14 DIAGNOSIS — M1711 Unilateral primary osteoarthritis, right knee: Secondary | ICD-10-CM | POA: Diagnosis not present

## 2017-01-31 ENCOUNTER — Encounter (HOSPITAL_COMMUNITY): Payer: Self-pay

## 2017-01-31 ENCOUNTER — Encounter (HOSPITAL_COMMUNITY)
Admission: RE | Disposition: A | Discharge status: Home / Self Care | Payer: Self-pay | Source: Ambulatory Visit | Attending: Internal Medicine

## 2017-01-31 ENCOUNTER — Ambulatory Visit (HOSPITAL_COMMUNITY)
Admission: RE | Admit: 2017-01-31 | Discharge: 2017-01-31 | Disposition: A | Discharge status: Home / Self Care | Payer: 59 | Source: Ambulatory Visit | Attending: Internal Medicine | Admitting: Internal Medicine

## 2017-01-31 DIAGNOSIS — Z8719 Personal history of other diseases of the digestive system: Secondary | ICD-10-CM

## 2017-01-31 DIAGNOSIS — K5730 Diverticulosis of large intestine without perforation or abscess without bleeding: Secondary | ICD-10-CM

## 2017-01-31 DIAGNOSIS — Z800 Family history of malignant neoplasm of digestive organs: Secondary | ICD-10-CM | POA: Diagnosis not present

## 2017-01-31 DIAGNOSIS — K5090 Crohn's disease, unspecified, without complications: Secondary | ICD-10-CM | POA: Diagnosis not present

## 2017-01-31 DIAGNOSIS — K9189 Other postprocedural complications and disorders of digestive system: Secondary | ICD-10-CM | POA: Diagnosis not present

## 2017-01-31 DIAGNOSIS — Z1211 Encounter for screening for malignant neoplasm of colon: Secondary | ICD-10-CM | POA: Insufficient documentation

## 2017-01-31 DIAGNOSIS — K6389 Other specified diseases of intestine: Secondary | ICD-10-CM | POA: Diagnosis not present

## 2017-01-31 DIAGNOSIS — D122 Benign neoplasm of ascending colon: Secondary | ICD-10-CM | POA: Diagnosis not present

## 2017-01-31 HISTORY — PX: COLONOSCOPY: SHX5424

## 2017-01-31 HISTORY — DX: Hyperlipidemia, unspecified: E78.5

## 2017-01-31 HISTORY — PX: BIOPSY: SHX5522

## 2017-01-31 LAB — HEPATIC FUNCTION PANEL
ALK PHOS: 124 U/L (ref 38–126)
ALT: 20 U/L (ref 17–63)
AST: 18 U/L (ref 15–41)
Albumin: 3.7 g/dL (ref 3.5–5.0)
BILIRUBIN DIRECT: 0.2 mg/dL (ref 0.1–0.5)
BILIRUBIN TOTAL: 1.1 mg/dL (ref 0.3–1.2)
Indirect Bilirubin: 0.9 mg/dL (ref 0.3–0.9)
Total Protein: 6.6 g/dL (ref 6.5–8.1)

## 2017-01-31 SURGERY — COLONOSCOPY
Anesthesia: Moderate Sedation

## 2017-01-31 MED ORDER — MEPERIDINE HCL 100 MG/ML IJ SOLN
INTRAMUSCULAR | Status: AC
  Filled 2017-01-31: qty 2

## 2017-01-31 MED ORDER — ONDANSETRON HCL 4 MG/2ML IJ SOLN
INTRAMUSCULAR | Status: AC
  Filled 2017-01-31: qty 2

## 2017-01-31 MED ORDER — STERILE WATER FOR IRRIGATION IR SOLN
Status: DC | PRN
  Administered 2017-01-31: 08:00:00

## 2017-01-31 MED ORDER — MIDAZOLAM HCL 5 MG/5ML IJ SOLN
INTRAMUSCULAR | Status: AC
  Filled 2017-01-31: qty 10

## 2017-01-31 MED ORDER — SODIUM CHLORIDE 0.9 % IV SOLN
INTRAVENOUS | Status: DC
  Administered 2017-01-31: 07:00:00 via INTRAVENOUS

## 2017-01-31 MED ORDER — MIDAZOLAM HCL 5 MG/5ML IJ SOLN
INTRAMUSCULAR | Status: DC | PRN
  Administered 2017-01-31 (×2): 2 mg via INTRAVENOUS
  Administered 2017-01-31: 1 mg via INTRAVENOUS

## 2017-01-31 MED ORDER — ONDANSETRON HCL 4 MG/2ML IJ SOLN
INTRAMUSCULAR | Status: DC | PRN
  Administered 2017-01-31: 4 mg via INTRAVENOUS

## 2017-01-31 MED ORDER — MEPERIDINE HCL 100 MG/ML IJ SOLN
INTRAMUSCULAR | Status: DC | PRN
  Administered 2017-01-31: 50 mg via INTRAVENOUS
  Administered 2017-01-31: 25 mg via INTRAVENOUS
  Administered 2017-01-31: 50 mg via INTRAVENOUS

## 2017-01-31 NOTE — H&P (Signed)
@LOGO @   Primary Care Physician:  Curlene Labrum, MD Primary Gastroenterologist:  Dr. [name]  Pre-Procedure History & Physical: HPI:  Bobby Gross is a 61 y.o. male here for surveillance colonoscopy. History of ileocolonic Crohn's status post distant right hemicolectomy. Doing well on all labeled Lialda.  Past Medical History:  Diagnosis Date  . B12 deficiency    s/p SB resection  . Crohn's disease ([place])    SMALL BOWEL     diagnosed 20 years ago  . Hyperlipemia   . Kidney stones     Past Surgical History:  Procedure Laterality Date  . COLONOSCOPY  10/03/2006   Normal rectum/Status post right hemicolectomy with a small bowel anastomosis,  fibrotic-appearing anastomosis consistent with Crohn's, status post  biopsy, but overall, things look really good today  . COLONOSCOPY  01/28/2012   Dr. Sabino Gasser R hemicolectomy for ileocolonic crohn's disease. disease appears inactive at this time.  all Bx's benign.  Marland Kitchen dexa scan  09/2010   normal  . RIGHT FOOT SURGER  2009   PLANTAR FASCITIS  . RIGHT SHOULDER SURGERY    . RIGHT WRIST SURGERY    . [place]    Prior to Admission medications   Medication Sig Start Date End Date Taking? Authorizing Provider  Calcium Carbonate-Vitamin D (CALCIUM 600+D PO) Take 1 tablet by mouth 4 (four) times a week.   Yes [provider]  cyanocobalamin (,VITAMIN B-12,) 1000 MCG/ML injection Inject 1 mL (1,000 mcg total) into the muscle every 30 (thirty) days. 12/11/16  Yes , [name], MD  levothyroxine (SYNTHROID, LEVOTHROID) 25 MCG tablet Take 25 mcg by mouth daily at 6 PM.   Yes [provider]  mesalamine (LIALDA) 1.2 g EC tablet Take 4 tablets (4.8 g total) by mouth daily. Patient taking differently: Take 1.2 g by mouth 4 (four) times daily.  12/11/16  Yes , [name], MD  Multiple Vitamin (MULTIVITAMIN WITH MINERALS) TABS Take 1 tablet by mouth daily.   Yes [provider]  Na Sulfate-K  Sulfate-Mg Sulf (SUPREP BOWEL PREP KIT) 17.5-3.13-1.6 GM/180ML SOLN Take 1 kit by mouth as directed. 12/12/16  Yes [name], MD    Allergies as of 12/12/2016  . (No Known Allergies)    Family History  Problem Relation Age of Onset  . Colon cancer Other     Social History   Social History  . Marital status: Married    Spouse name: N/A  . Number of children: N/A  . Years of education: N/A   Occupational History  . Not on file.   Social History Main Topics  . Smoking status: Never Smoker  . Smokeless tobacco: Never Used     Comment: Never smoker  . Alcohol use No  . Drug use: No  . Sexual activity: Not on file   Other Topics Concern  . Not on file   Social History Narrative  . No narrative on file    Review of Systems: See HPI, otherwise negative ROS  Physical Exam: BP 133/83   Pulse 68   Temp 98.3 F (36.8 C) (Oral)   Resp 13   Ht 6' (1.829 m)   Wt 180 lb (81.6 kg)   SpO2 100%   BMI 24.41 kg/m  General:   Alert,  Well-developed, well-nourished, pleasant and cooperative in NAD Neck:  Supple; no masses or thyromegaly. No significant cervical adenopathy. Lungs:  Clear throughout to auscultation.   No  wheezes, crackles, or rhonchi. No acute distress. Heart:  Regular rate and rhythm; no murmurs, clicks, rubs,  or gallops. Abdomen: Non-distended, normal bowel sounds.  Soft and nontender without appreciable mass or hepatosplenomegaly.  Pulses:  Normal pulses noted. Extremities:  Without clubbing or edema.  Impression:  61 year old gentleman with ileocolonic Crohn's disease status post right hemicolectomy ; due for a colonoscopy this time.  Recommendations: I have offered the patient a surveillance colonoscopy today.The risks, benefits, limitations, alternatives and imponderables have been reviewed with the patient. Questions have been answered. All parties are agreeable.     Notice: This dictation was prepared with Dragon dictation along with smaller  phrase technology. Any transcriptional errors that result from this process are unintentional and may not be corrected upon review.

## 2017-01-31 NOTE — Op Note (Signed)
[facility] Patient Name: Bobby Gross Procedure Date: 01/31/2017 7:00 AM MRN: [id number] Date of Birth: [birth year] Attending MD: Norvel Richards , MD CSN: [id number] Age: 61 Admit Type: Outpatient Procedure:                Colonoscopy Indications:              High risk colon cancer surveillance: Crohn's disease Providers:                Norvel Richards, MD, Otis Peak B. Gwenlyn Perking RN, RN,                            Aram Candela Referring MD:              Medicines:                Midazolam 5 mg IV, Meperidine 125 mg IV,                            Ondansetron 4 mg IV Complications:            No immediate complications. Estimated Blood Loss:     Estimated blood loss: none. Procedure:                Pre-Anesthesia Assessment:                           - Prior to the procedure, a History and Physical                            was performed, and patient medications and                            allergies were reviewed. The patient's tolerance of                            previous anesthesia was also reviewed. The risks                            and benefits of the procedure and the sedation                            options and risks were discussed with the patient.                            All questions were answered, and informed consent                            was obtained. Prior Anticoagulants: The patient has                            taken no previous anticoagulant or antiplatelet                            agents. ASA Grade Assessment: II - A patient with  mild systemic disease. After reviewing the risks                            and benefits, the patient was deemed in                            satisfactory condition to undergo the procedure.                           After obtaining informed consent, the colonoscope                            was passed under direct vision. Throughout the                            procedure, the  patient's blood pressure, pulse, and                            oxygen saturations were monitored continuously. The                            EC-[id number] ([id number]) scope was introduced through                            the anus and advanced to the the ileocolonic                            anastomosis. The colonoscopy was performed without                            difficulty. The patient tolerated the procedure                            well. The quality of the bowel preparation was                            adequate. The terminal ileum, ileocecal valve,                            appendiceal orifice, and rectum were photographed. Scope In: 7:59:42 AM Scope Out: 8:14:10 AM Scope Withdrawal Time: 0 hours 9 minutes 33 seconds  Total Procedure Duration: 0 hours 14 minutes 28 seconds  Findings:      The perianal and digital rectal examinations were normal.      Scattered small-mouthed diverticula were found in the sigmoid colon,       descending colon and transverse colon.      Surgical anastomosis identified.An area of mucosa was found in the       colon. fibrotic lumen at the anastomosis proximally 6-7 mm. Would not       get the adult oscope. I was able to see thedistal terminal ileum several       centimeters upstream, however and it appeared normal. the anastomosis       and the colonic mucosa proximal to it was biopsied.This was biopsied       with  a cold forceps for histology.      The exam was otherwise without abnormality on direct and retroflexion       views. Impression:               - Diverticulosis in the sigmoid colon, in the                            descending colon and in the transverse colon.                           - fibrosed stenotic changes at the anastomosis                            consistent with Crohn's disease. The bowel remained                            patent at this level, however. Biopsied.                           - The examination was otherwise  normal on direct                            and retroflexion views. Moderate Sedation:      Moderate (conscious) sedation was administered by the endoscopy nurse       and supervised by the endoscopist. The following parameters were       monitored: oxygen saturation, heart rate, blood pressure, respiratory       rate, EKG, adequacy of pulmonary ventilation, and response to care.       Total physician intraservice time was 31 minutes. Recommendation:           - Patient has a contact number available for                            emergencies. The signs and symptoms of potential                            delayed complications were discussed with the                            patient. Return to normal activities tomorrow.                            Written discharge instructions were provided to the                            patient.                           - Resume previous diet.                           - Continue present medications. Continue off label                            Lialda.                           -  Repeat colonoscopy date to be determined after                            pending pathology results are reviewed for                            surveillance.                           - Return to GI clinic in 6 months. Procedure Code(s):        --- Professional ---                           [phone], Colonoscopy, flexible; with biopsy, single                            or multiple                           99152, Moderate sedation services provided by the                            same physician or other qualified health care                            professional performing the diagnostic or                            therapeutic service that the sedation supports,                            requiring the presence of an independent trained                            observer to assist in the monitoring of the                            patient's level of consciousness  and physiological                            status; initial 15 minutes of intraservice time,                            patient age 77 years or older                           [phone], Moderate sedation services; each additional                            15 minutes intraservice time Diagnosis Code(s):        --- Professional ---                           K50.90, Crohn's disease, unspecified, without  complications                           K63.89, Other specified diseases of intestine                           K57.30, Diverticulosis of large intestine without                            perforation or abscess without bleeding CPT copyright 2016 American Medical Association. All rights reserved. The codes documented in this report are preliminary and upon coder review may  be revised to meet current compliance requirements. [name]. , MD Norvel Richards, MD 01/31/2017 8:28:16 AM This report has been signed electronically. Number of Addenda: 0

## 2017-01-31 NOTE — Discharge Instructions (Signed)
°  Colonoscopy Discharge Instructions  Read the instructions outlined below and refer to this sheet in the next few weeks. These discharge instructions provide you with general information on caring for yourself after you leave the hospital. Your doctor may also give you specific instructions. While your treatment has been planned according to the most current medical practices available, unavoidable complications occasionally occur. If you have any problems or questions after discharge, call Dr. [name] at [phone]. ACTIVITY  You may resume your regular activity, but move at a slower pace for the next 24 hours.   Take frequent rest periods for the next 24 hours.   Walking will help get rid of the air and reduce the bloated feeling in your belly (abdomen).   No driving for 24 hours (because of the medicine (anesthesia) used during the test).    Do not sign any important legal documents or operate any machinery for 24 hours (because of the anesthesia used during the test).  NUTRITION  Drink plenty of fluids.   You may resume your normal diet as instructed by your doctor.   Begin with a light meal and progress to your normal diet. Heavy or fried foods are harder to digest and may make you feel sick to your stomach (nauseated).   Avoid alcoholic beverages for 24 hours or as instructed.  MEDICATIONS  You may resume your normal medications unless your doctor tells you otherwise.  WHAT YOU CAN EXPECT TODAY  Some feelings of bloating in the abdomen.   Passage of more gas than usual.   Spotting of blood in your stool or on the toilet paper.  IF YOU HAD POLYPS REMOVED DURING THE COLONOSCOPY:  No aspirin products for 7 days or as instructed.   No alcohol for 7 days or as instructed.   Eat a soft diet for the next 24 hours.  FINDING OUT THE RESULTS OF YOUR TEST Not all test results are available during your visit. If your test results are not back during the visit, make an appointment  with your caregiver to find out the results. Do not assume everything is normal if you have not heard from your caregiver or the medical facility. It is important for you to follow up on all of your test results.  SEEK IMMEDIATE MEDICAL ATTENTION IF:  You have more than a spotting of blood in your stool.   Your belly is swollen (abdominal distention).   You are nauseated or vomiting.   You have a temperature over 101.   You have abdominal pain or discomfort that is severe or gets worse throughout the day.    Diverticulosis information provided  Continue Lialda  Hepatic profile today  Office visit with [place] in 6 months  Further recommendations to follow pending review of pathology report

## 2017-02-03 ENCOUNTER — Encounter: Payer: Self-pay | Admitting: Internal Medicine

## 2017-02-05 ENCOUNTER — Encounter (HOSPITAL_COMMUNITY): Payer: Self-pay | Admitting: Internal Medicine

## 2017-02-11 DIAGNOSIS — L570 Actinic keratosis: Secondary | ICD-10-CM | POA: Diagnosis not present

## 2017-02-11 DIAGNOSIS — L509 Urticaria, unspecified: Secondary | ICD-10-CM | POA: Diagnosis not present

## 2017-03-06 DIAGNOSIS — M1711 Unilateral primary osteoarthritis, right knee: Secondary | ICD-10-CM | POA: Diagnosis not present

## 2017-03-06 DIAGNOSIS — M7062 Trochanteric bursitis, left hip: Secondary | ICD-10-CM | POA: Diagnosis not present

## 2017-03-06 DIAGNOSIS — M1712 Unilateral primary osteoarthritis, left knee: Secondary | ICD-10-CM | POA: Diagnosis not present

## 2017-04-03 ENCOUNTER — Encounter: Payer: Self-pay | Admitting: *Deleted

## 2017-04-03 DIAGNOSIS — M791 Myalgia: Secondary | ICD-10-CM | POA: Diagnosis not present

## 2017-04-03 DIAGNOSIS — R072 Precordial pain: Secondary | ICD-10-CM | POA: Diagnosis not present

## 2017-04-03 DIAGNOSIS — R5383 Other fatigue: Secondary | ICD-10-CM | POA: Diagnosis not present

## 2017-04-07 ENCOUNTER — Encounter: Payer: Self-pay | Admitting: Cardiology

## 2017-04-07 NOTE — Progress Notes (Signed)
Cardiology Office Note  Date: 04/08/2017   ID: Bobby [facility], DOB 05-20-1956, MRN [id number]  PCP: Bobby Labrum, MD  Consulting Cardiologist: Bobby Lesches, MD   Chief Complaint  Patient presents with  . Chest Pain  . Fatigue    History of Present Illness: [facility] Gross is a 61 y.o. male referred for cardiology consultation by Dr. Quintin Gross for the assessment of chest pain. He presents today to discuss recent symptoms. 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 He works for the [employer] in [place], is outdoors a lot. He states that about a week ago he was outside mowing the grass in his front lawn, felt like he "hit a wall," was overly fatigued and had to stop what he was doing. He went inside and states that it felt like it took a long time for his breathing to settle down. Since that time he has continued to have periodic episodes of fatigue with activity, also a tightness in his upper left arm and chest. Somewhat achy particularly in his legs, eventually scheduled a visit last week with [facility], saw Dr. Quintin Gross and was placed on doxycycline as he had also reported having some recent tick bites.  I personally reviewed the recent ECG done at [facility] last week showing normal sinus rhythm. [facility] reports no personal history of CAD. He underwent a stress test back in 2005 with concerns about family history of CAD in his father and brother. He has not had follow-up ischemic testing since that time.  Additional cardiac risk factors include hyperlipidemia, labwork follow-through [facility]. He is a nonsmoker.  He also reports a history of small bowel Crohn's disease, had surgery in the past at [facility]. Generally he does not report any major stool changes or blood in his stools, but he recently started taking Excedrin due to headaches over the  last week and has noticed a scant amount of blood in his stools.  Past Medical History:  Diagnosis Date  . B12 deficiency    Following small bowel resection  . Crohn's disease ([place])    Involving small bowel  . History of nephrolithiasis   . Hyperlipemia     Past Surgical History:  Procedure Laterality Date  . BIOPSY  01/31/2017   Procedure: BIOPSY;  Surgeon: Bobby Dolin, MD;  Location: [facility];  Service: Endoscopy;;  biopsy    . COLONOSCOPY  10/03/2006   Normal rectum/Status post right hemicolectomy with a small bowel anastomosis,  fibrotic-appearing anastomosis consistent with Crohn's, status post  biopsy, but overall, things look really good today  . COLONOSCOPY  01/28/2012   Dr. Sabino Gasser [facility] hemicolectomy for ileocolonic crohn's disease. disease appears inactive at this time.  all Bx's benign.  . COLONOSCOPY N/A 01/31/2017   Procedure: COLONOSCOPY;  Surgeon: Bobby Dolin, MD;  Location: [facility];  Service: Endoscopy;  Laterality: N/A;  7:30am  . DEXA scan  09/2010  . RIGHT FOOT SURGER  2009   PLANTAR FASCITIS  . RIGHT SHOULDER SURGERY    . RIGHT WRIST SURGERY    . SMALL INTESTINE SURGERY  1988   AT [facility]    Current Outpatient Prescriptions  Medication Sig Dispense Refill  . Calcium Carbonate-Vitamin D (CALCIUM 600+D PO) Take 1 tablet by mouth 4 (four) times a week.    . cyanocobalamin (,VITAMIN B-12,) 1000 MCG/ML  injection Inject 1 mL (1,000 mcg total) into the muscle every 30 (thirty) days. 1 mL 11  . doxycycline (VIBRAMYCIN) 100 MG capsule Take 100 mg by mouth 2 (two) times daily.    Marland Kitchen levothyroxine (SYNTHROID, LEVOTHROID) 25 MCG tablet Take 25 mcg by mouth daily at 6 PM.    . mesalamine (LIALDA) 1.2 g EC tablet Take 4 tablets (4.8 g total) by mouth daily. (Patient taking differently: Take 1.2 g by mouth 4 (four) times daily. ) 360 tablet 3  . Multiple Vitamin (MULTIVITAMIN WITH MINERALS) TABS Take 1 tablet by mouth daily.    Marland Kitchen aspirin EC 81 MG tablet Take 1  tablet (81 mg total) by mouth daily.     No current facility-administered medications for this visit.    Allergies:  Patient has no known allergies.   Social History: The patient  reports that he has never smoked. He has never used smokeless tobacco. He reports that he does not drink alcohol or use drugs.   Family History: The patient's family history includes Colon cancer in his other.   ROS:  Please see the history of present illness. Otherwise, complete review of systems is positive for bilateral arthritic knee pain.  All other systems are reviewed and negative.   Physical Exam: VS:  BP 138/84   Pulse 63   Ht 6' (1.829 m)   Wt 176 lb (79.8 kg)   SpO2 98%   BMI 23.87 kg/m , BMI Body mass index is 23.87 kg/m.  Wt Readings from Last 3 Encounters:  04/08/17 176 lb (79.8 kg)  01/31/17 180 lb (81.6 kg)  12/11/16 180 lb 6.4 oz (81.8 kg)    General: Patient appears comfortable at rest. HEENT: Conjunctiva and lids normal, oropharynx clear. Neck: Supple, no elevated JVP or carotid bruits, no thyromegaly. Lungs: Clear to auscultation, nonlabored breathing at rest. Cardiac: Regular rate and rhythm, no S3, 2/6 systolic murmur at base, no pericardial rub. Abdomen: Soft, nontender, bowel sounds present, no guarding or rebound. Extremities: No pitting edema, distal pulses 2+. Skin: Warm and dry. Musculoskeletal: No kyphosis. Neuropsychiatric: Alert and oriented x3, affect grossly appropriate.  ECG: I personally reviewed the tracing from2/09/2003 which showed sinus bradycardia with PAC.  Recent Labwork: 01/31/2017: ALT 20; AST 14 January 2017: TSH 4.45  Assessment and Plan:  1. Recent onset exertional fatigue, intermittent chest and upper left arm tightness, symptoms concerning for accelerating angina with onset last Sunday. Recent ECG normal. Cardiac risk factors include age and gender, family history of CAD in male relatives, also hyperlipidemia. He does report some recent tick bites and  was placed prophylactically on doxycycline at [facility]. He has not undergone any interval ischemic evaluation since 2005. He is very worried about his symptoms, particular in light of family history. We discussed options for further cardiac assessment including both noninvasive and invasive techniques. After reviewing the risks and benefits he is in agreement to proceed with a diagnostic cardiac catheterization. He will take a baby aspirin daily for now, we will obtain preprocedure chest x-ray and lab work.  2. History of hyperlipidemia, follows lab work with Dr. Pleas Koch at [facility]. Currently not on statin therapy.  3. History of Crohn's disease affecting small bowel status post surgery at [facility] in the past. Reports no recent symptoms although does mention some blood in the stool with taking Excedrin recently. Potential increased risk of GI bleeding needs to be considered if he undergoes PCI with need for dual antiplatelet therapy.  4. History of  hypothyroidism, on Synthroid.  Current medicines were reviewed with the patient today.   Orders Placed This Encounter  Procedures  . DG Chest 2 View  . CBC  . Basic metabolic panel  . APTT  . Protime-INR    Disposition: Follow-up after cardiac catheterization.  Signed, Satira Sark, MD, [facility] 04/08/2017 10:17 AM    [facility], [place], [place] [address] Phone: [phone]; Fax: [phone]

## 2017-04-08 ENCOUNTER — Other Ambulatory Visit: Payer: Self-pay | Admitting: Cardiology

## 2017-04-08 ENCOUNTER — Telehealth: Payer: Self-pay | Admitting: Cardiology

## 2017-04-08 ENCOUNTER — Encounter: Payer: Self-pay | Admitting: *Deleted

## 2017-04-08 ENCOUNTER — Encounter: Payer: Self-pay | Admitting: Cardiology

## 2017-04-08 ENCOUNTER — Other Ambulatory Visit (HOSPITAL_COMMUNITY)
Admission: RE | Admit: 2017-04-08 | Discharge: 2017-04-08 | Disposition: A | Discharge status: Home / Self Care | Payer: 59 | Source: Ambulatory Visit | Attending: Cardiology | Admitting: Cardiology

## 2017-04-08 ENCOUNTER — Ambulatory Visit (INDEPENDENT_AMBULATORY_CARE_PROVIDER_SITE_OTHER): Payer: 59 | Admitting: Cardiology

## 2017-04-08 ENCOUNTER — Ambulatory Visit (HOSPITAL_COMMUNITY)
Admission: RE | Admit: 2017-04-08 | Discharge: 2017-04-08 | Disposition: A | Discharge status: Home / Self Care | Payer: 59 | Source: Ambulatory Visit | Attending: Cardiology | Admitting: Cardiology

## 2017-04-08 VITALS — BP 138/84 | HR 63 | Ht 72.0 in | Wt 176.0 lb

## 2017-04-08 DIAGNOSIS — Z8249 Family history of ischemic heart disease and other diseases of the circulatory system: Secondary | ICD-10-CM | POA: Diagnosis not present

## 2017-04-08 DIAGNOSIS — E039 Hypothyroidism, unspecified: Secondary | ICD-10-CM

## 2017-04-08 DIAGNOSIS — Z8719 Personal history of other diseases of the digestive system: Secondary | ICD-10-CM | POA: Diagnosis not present

## 2017-04-08 DIAGNOSIS — Z01812 Encounter for preprocedural laboratory examination: Secondary | ICD-10-CM | POA: Insufficient documentation

## 2017-04-08 DIAGNOSIS — I200 Unstable angina: Secondary | ICD-10-CM | POA: Insufficient documentation

## 2017-04-08 DIAGNOSIS — I700 Atherosclerosis of aorta: Secondary | ICD-10-CM | POA: Diagnosis not present

## 2017-04-08 DIAGNOSIS — E782 Mixed hyperlipidemia: Secondary | ICD-10-CM | POA: Diagnosis not present

## 2017-04-08 DIAGNOSIS — R072 Precordial pain: Secondary | ICD-10-CM | POA: Insufficient documentation

## 2017-04-08 LAB — CBC
HEMATOCRIT: 43.4 % (ref 39.0–52.0)
HEMOGLOBIN: 14 g/dL (ref 13.0–17.0)
MCH: 27.4 pg (ref 26.0–34.0)
MCHC: 32.3 g/dL (ref 30.0–36.0)
MCV: 84.9 fL (ref 78.0–100.0)
Platelets: 203 10*3/uL (ref 150–400)
RBC: 5.11 MIL/uL (ref 4.22–5.81)
RDW: 15.5 % (ref 11.5–15.5)
WBC: 5.7 10*3/uL (ref 4.0–10.5)

## 2017-04-08 LAB — BASIC METABOLIC PANEL
Anion gap: 8 (ref 5–15)
BUN: 11 mg/dL (ref 6–20)
CHLORIDE: 104 mmol/L (ref 101–111)
CO2: 25 mmol/L (ref 22–32)
CREATININE: 1.02 mg/dL (ref 0.61–1.24)
Calcium: 9.6 mg/dL (ref 8.9–10.3)
GFR calc Af Amer: >60 mL/min (ref >60)
GLUCOSE: 103 mg/dL — AB (ref 65–99)
POTASSIUM: 4.1 mmol/L (ref 3.5–5.1)
SODIUM: 137 mmol/L (ref 135–145)

## 2017-04-08 LAB — PROTIME-INR
INR: 1.05
Prothrombin Time: 13.6 seconds (ref 11.4–15.2)

## 2017-04-08 LAB — APTT: aPTT: 29 seconds (ref 24–36)

## 2017-04-08 MED ORDER — ASPIRIN EC 81 MG PO TBEC: 81.0000 mg | DELAYED_RELEASE_TABLET | Freq: Every day | ORAL | Status: DC

## 2017-04-08 NOTE — Telephone Encounter (Signed)
Pre-cert Verification for the following procedure   Left heart cath - Wednesday, 9/12 - 2:30 - Tamala Julian

## 2017-04-08 NOTE — Patient Instructions (Addendum)
Medication Instructions:   Begin Aspirin 54m daily.   Continue all other current medications.  Labwork:  BMET, CBC, PT, PTT  Chest x-ray   Orders given today for above.   Testing/Procedures: Your physician has requested that you have a cardiac catheterization. Cardiac catheterization is used to diagnose and/or treat various heart conditions. Doctors may recommend this procedure for a number of different reasons. The most common reason is to evaluate chest pain. Chest pain can be a symptom of coronary artery disease (CAD), and cardiac catheterization can show whether plaque is narrowing or blocking your heart's arteries. This procedure is also used to evaluate the valves, as well as measure the blood flow and oxygen levels in different parts of your heart. For further information please visit [email or web address] Please follow instruction sheet, as given.  Follow-Up: 1 month   Any Other Special Instructions Will Be Listed Below (If Applicable).  If you need a refill on your cardiac medications before your next appointment, please call your pharmacy.

## 2017-04-09 ENCOUNTER — Telehealth: Payer: Self-pay

## 2017-04-09 NOTE — Telephone Encounter (Signed)
Left message for Pt requesting call back.  Left on message to take ASA prior to arrival. Left this nurse name and # for call back if any questions.

## 2017-04-10 ENCOUNTER — Encounter (HOSPITAL_COMMUNITY)
Admission: RE | Disposition: A | Discharge status: Home / Self Care | Payer: Self-pay | Source: Ambulatory Visit | Attending: Interventional Cardiology

## 2017-04-10 ENCOUNTER — Ambulatory Visit (HOSPITAL_COMMUNITY)
Admission: RE | Admit: 2017-04-10 | Discharge: 2017-04-10 | Disposition: A | Discharge status: Home / Self Care | Payer: 59 | Source: Ambulatory Visit | Attending: Interventional Cardiology | Admitting: Interventional Cardiology

## 2017-04-10 DIAGNOSIS — Z8249 Family history of ischemic heart disease and other diseases of the circulatory system: Secondary | ICD-10-CM | POA: Insufficient documentation

## 2017-04-10 DIAGNOSIS — Z9049 Acquired absence of other specified parts of digestive tract: Secondary | ICD-10-CM | POA: Diagnosis not present

## 2017-04-10 DIAGNOSIS — R079 Chest pain, unspecified: Secondary | ICD-10-CM | POA: Diagnosis present

## 2017-04-10 DIAGNOSIS — R5383 Other fatigue: Secondary | ICD-10-CM | POA: Diagnosis not present

## 2017-04-10 DIAGNOSIS — E039 Hypothyroidism, unspecified: Secondary | ICD-10-CM | POA: Insufficient documentation

## 2017-04-10 DIAGNOSIS — I200 Unstable angina: Secondary | ICD-10-CM | POA: Diagnosis not present

## 2017-04-10 DIAGNOSIS — Z7982 Long term (current) use of aspirin: Secondary | ICD-10-CM | POA: Insufficient documentation

## 2017-04-10 DIAGNOSIS — E785 Hyperlipidemia, unspecified: Secondary | ICD-10-CM | POA: Diagnosis not present

## 2017-04-10 DIAGNOSIS — R0789 Other chest pain: Secondary | ICD-10-CM

## 2017-04-10 DIAGNOSIS — E538 Deficiency of other specified B group vitamins: Secondary | ICD-10-CM | POA: Insufficient documentation

## 2017-04-10 DIAGNOSIS — R072 Precordial pain: Secondary | ICD-10-CM | POA: Diagnosis not present

## 2017-04-10 HISTORY — PX: LEFT HEART CATH AND CORONARY ANGIOGRAPHY: CATH118249

## 2017-04-10 SURGERY — LEFT HEART CATH AND CORONARY ANGIOGRAPHY
Anesthesia: LOCAL

## 2017-04-10 MED ORDER — SODIUM CHLORIDE 0.9% FLUSH: 3.0000 mL | Freq: Two times a day (BID) | INTRAVENOUS | Status: DC

## 2017-04-10 MED ORDER — IOPAMIDOL (ISOVUE-370) INJECTION 76%
INTRAVENOUS | Status: AC
  Filled 2017-04-10: qty 100

## 2017-04-10 MED ORDER — FENTANYL CITRATE (PF) 100 MCG/2ML IJ SOLN
INTRAMUSCULAR | Status: AC
  Filled 2017-04-10: qty 2

## 2017-04-10 MED ORDER — SODIUM CHLORIDE 0.9 % IV SOLN: INTRAVENOUS | Status: DC

## 2017-04-10 MED ORDER — HEPARIN (PORCINE) IN NACL 2-0.9 UNIT/ML-% IJ SOLN
INTRAMUSCULAR | Status: AC | PRN
  Administered 2017-04-10: 1000 mL

## 2017-04-10 MED ORDER — SODIUM CHLORIDE 0.9% FLUSH: 3.0000 mL | INTRAVENOUS | Status: DC | PRN

## 2017-04-10 MED ORDER — SODIUM CHLORIDE 0.9 % IV SOLN
250.0000 mL | INTRAVENOUS | Status: DC | PRN
Start: 2017-04-10 — End: 2017-04-10

## 2017-04-10 MED ORDER — SODIUM CHLORIDE 0.9 % WEIGHT BASED INFUSION
3.0000 mL/kg/h | INTRAVENOUS | Status: AC
  Administered 2017-04-10: 3 mL/kg/h via INTRAVENOUS

## 2017-04-10 MED ORDER — HEPARIN (PORCINE) IN NACL 2-0.9 UNIT/ML-% IJ SOLN
INTRAMUSCULAR | Status: AC
  Filled 2017-04-10: qty 1000

## 2017-04-10 MED ORDER — MIDAZOLAM HCL 2 MG/2ML IJ SOLN
INTRAMUSCULAR | Status: AC
  Filled 2017-04-10: qty 2

## 2017-04-10 MED ORDER — LIDOCAINE HCL (PF) 1 % IJ SOLN
INTRAMUSCULAR | Status: DC | PRN
  Administered 2017-04-10: 2 mL

## 2017-04-10 MED ORDER — ATORVASTATIN CALCIUM 80 MG PO TABS
80.0000 mg | ORAL_TABLET | ORAL | Status: AC
  Administered 2017-04-10: 80 mg via ORAL
  Filled 2017-04-10: qty 1

## 2017-04-10 MED ORDER — ONDANSETRON HCL 4 MG/2ML IJ SOLN: 4.0000 mg | Freq: Four times a day (QID) | INTRAMUSCULAR | Status: DC | PRN

## 2017-04-10 MED ORDER — SODIUM CHLORIDE 0.9 % WEIGHT BASED INFUSION: 1.0000 mL/kg/h | INTRAVENOUS | Status: DC

## 2017-04-10 MED ORDER — ASPIRIN 81 MG PO CHEW: 81.0000 mg | CHEWABLE_TABLET | ORAL | Status: DC

## 2017-04-10 MED ORDER — IOPAMIDOL (ISOVUE-370) INJECTION 76%
INTRAVENOUS | Status: DC | PRN
  Administered 2017-04-10: 55 mL via INTRA_ARTERIAL

## 2017-04-10 MED ORDER — VERAPAMIL HCL 2.5 MG/ML IV SOLN
INTRAVENOUS | Status: AC
  Filled 2017-04-10: qty 2

## 2017-04-10 MED ORDER — HEPARIN SODIUM (PORCINE) 1000 UNIT/ML IJ SOLN
INTRAMUSCULAR | Status: AC
  Filled 2017-04-10: qty 1

## 2017-04-10 MED ORDER — ATORVASTATIN CALCIUM 80 MG PO TABS
80.0000 mg | ORAL_TABLET | Freq: Every day | ORAL | Status: DC
  Filled 2017-04-10: qty 1

## 2017-04-10 MED ORDER — VERAPAMIL HCL 2.5 MG/ML IV SOLN
INTRAVENOUS | Status: DC | PRN
  Administered 2017-04-10: 10 mL via INTRA_ARTERIAL

## 2017-04-10 MED ORDER — ACETAMINOPHEN 325 MG PO TABS: 650.0000 mg | ORAL_TABLET | ORAL | Status: DC | PRN

## 2017-04-10 MED ORDER — SODIUM CHLORIDE 0.9 % IV SOLN: 250.0000 mL | INTRAVENOUS | Status: DC | PRN

## 2017-04-10 MED ORDER — MIDAZOLAM HCL 2 MG/2ML IJ SOLN
INTRAMUSCULAR | Status: DC | PRN
  Administered 2017-04-10 (×2): 1 mg via INTRAVENOUS

## 2017-04-10 MED ORDER — FENTANYL CITRATE (PF) 100 MCG/2ML IJ SOLN
INTRAMUSCULAR | Status: DC | PRN
  Administered 2017-04-10 (×2): 50 ug via INTRAVENOUS

## 2017-04-10 MED ORDER — HEPARIN SODIUM (PORCINE) 1000 UNIT/ML IJ SOLN
INTRAMUSCULAR | Status: DC | PRN
  Administered 2017-04-10: 4000 [IU] via INTRAVENOUS

## 2017-04-10 SURGICAL SUPPLY — 12 items
CATH EXPO 5F FL3.5 (CATHETERS) ×2 IMPLANT
CATH INFINITI JR4 5F (CATHETERS) ×2 IMPLANT
COVER PRB 48X5XTLSCP FOLD TPE (BAG) ×1 IMPLANT
COVER PROBE 5X48 (BAG) ×2
DEVICE RAD COMP TR BAND LRG (VASCULAR PRODUCTS) ×2 IMPLANT
GLIDESHEATH SLEND A-KIT 6F 22G (SHEATH) ×2 IMPLANT
GUIDEWIRE INQWIRE 1.5J.035X260 (WIRE) ×1 IMPLANT
INQWIRE 1.5J .035X260CM (WIRE) ×2
KIT HEART LEFT (KITS) ×2 IMPLANT
PACK CARDIAC CATHETERIZATION (CUSTOM PROCEDURE TRAY) ×2 IMPLANT
TRANSDUCER W/STOPCOCK (MISCELLANEOUS) ×2 IMPLANT
TUBING CIL FLEX 10 FLL-RA (TUBING) ×2 IMPLANT

## 2017-04-10 NOTE — Discharge Instructions (Signed)

## 2017-04-10 NOTE — H&P (View-Only) (Signed)
Cardiology Office Note  Date: 04/08/2017   ID: BYAN POPLASKI, DOB 22-Nov-1955, MRN [id number]  PCP: Curlene Labrum, MD  Consulting Cardiologist: Rozann Lesches, MD   Chief Complaint  Patient presents with  . Chest Pain  . Fatigue    History of Present Illness: [facility] is a 61 y.o. male referred for cardiology consultation by Dr. Quintin Alto for the assessment of chest pain. He presents today to discuss recent symptoms. 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 He works for the [employer] in [place], is outdoors a lot. He states that about a week ago he was outside mowing the grass in his front lawn, felt like he "hit a wall," was overly fatigued and had to stop what he was doing. He went inside and states that it felt like it took a long time for his breathing to settle down. Since that time he has continued to have periodic episodes of fatigue with activity, also a tightness in his upper left arm and chest. Somewhat achy particularly in his legs, eventually scheduled a visit last week with [facility], saw Dr. Quintin Alto and was placed on doxycycline as he had also reported having some recent tick bites.  I personally reviewed the recent ECG done at [facility] last week showing normal sinus rhythm. Mr. Linhardt reports no personal history of CAD. He underwent a stress test back in 2005 with concerns about family history of CAD in his father and brother. He has not had follow-up ischemic testing since that time.  Additional cardiac risk factors include hyperlipidemia, labwork follow-through [facility]. He is a nonsmoker.  He also reports a history of small bowel Crohn's disease, had surgery in the past at [facility]. Generally he does not report any major stool changes or blood in his stools, but he recently started taking Excedrin due to headaches over the  last week and has noticed a scant amount of blood in his stools.  Past Medical History:  Diagnosis Date  . B12 deficiency    Following small bowel resection  . Crohn's disease ([place])    Involving small bowel  . History of nephrolithiasis   . Hyperlipemia     Past Surgical History:  Procedure Laterality Date  . BIOPSY  01/31/2017   Procedure: BIOPSY;  Surgeon: [name], MD;  [facility];  Service: Endoscopy;;  biopsy    . COLONOSCOPY  10/03/2006   Normal rectum/Status post right hemicolectomy with a small bowel anastomosis,  fibrotic-appearing anastomosis consistent with Crohn's, status post  biopsy, but overall, things look really good today  . COLONOSCOPY  01/28/2012   Dr. Sabino Gasser R hemicolectomy for ileocolonic crohn's disease. disease appears inactive at this time.  all Bx's benign.  . COLONOSCOPY N/A 01/31/2017   Procedure: COLONOSCOPY;  Surgeon: [name], MD;  [facility];  Service: Endoscopy;  Laterality: N/A;  7:30am  . DEXA scan  09/2010  . RIGHT FOOT SURGER  2009   PLANTAR FASCITIS  . RIGHT SHOULDER SURGERY    . RIGHT WRIST SURGERY    . SMALL INTESTINE SURGERY  1988   AT [facility]    Current Outpatient Prescriptions  Medication Sig Dispense Refill  . Calcium Carbonate-Vitamin D (CALCIUM 600+D PO) Take 1 tablet by mouth 4 (four) times a week.    . cyanocobalamin (,VITAMIN B-12,) 1000 MCG/ML  injection Inject 1 mL (1,000 mcg total) into the muscle every 30 (thirty) days. 1 mL 11  . doxycycline (VIBRAMYCIN) 100 MG capsule Take 100 mg by mouth 2 (two) times daily.    Marland Kitchen levothyroxine (SYNTHROID, LEVOTHROID) 25 MCG tablet Take 25 mcg by mouth daily at 6 PM.    . mesalamine (LIALDA) 1.2 g EC tablet Take 4 tablets (4.8 g total) by mouth daily. (Patient taking differently: Take 1.2 g by mouth 4 (four) times daily. ) 360 tablet 3  . Multiple Vitamin (MULTIVITAMIN WITH MINERALS) TABS Take 1 tablet by mouth daily.    Marland Kitchen aspirin EC 81 MG tablet Take 1  tablet (81 mg total) by mouth daily.     No current facility-administered medications for this visit.    Allergies:  Patient has no known allergies.   Social History: The patient  reports that he has never smoked. He has never used smokeless tobacco. He reports that he does not drink alcohol or use drugs.   Family History: The patient's family history includes Colon cancer in his other.   ROS:  Please see the history of present illness. Otherwise, complete review of systems is positive for bilateral arthritic knee pain.  All other systems are reviewed and negative.   Physical Exam: VS:  BP 138/84   Pulse 63   Ht 6' (1.829 m)   Wt 176 lb (79.8 kg)   SpO2 98%   BMI 23.87 kg/m , BMI Body mass index is 23.87 kg/m.  Wt Readings from Last 3 Encounters:  04/08/17 176 lb (79.8 kg)  01/31/17 180 lb (81.6 kg)  12/11/16 180 lb 6.4 oz (81.8 kg)    General: Patient appears comfortable at rest. HEENT: Conjunctiva and lids normal, oropharynx clear. Neck: Supple, no elevated JVP or carotid bruits, no thyromegaly. Lungs: Clear to auscultation, nonlabored breathing at rest. Cardiac: Regular rate and rhythm, no S3, 2/6 systolic murmur at base, no pericardial rub. Abdomen: Soft, nontender, bowel sounds present, no guarding or rebound. Extremities: No pitting edema, distal pulses 2+. Skin: Warm and dry. Musculoskeletal: No kyphosis. Neuropsychiatric: Alert and oriented x3, affect grossly appropriate.  ECG: I personally reviewed the tracing from2/09/2003 which showed sinus bradycardia with PAC.  Recent Labwork: 01/31/2017: ALT 20; AST 14 January 2017: TSH 4.45  Assessment and Plan:  1. Recent onset exertional fatigue, intermittent chest and upper left arm tightness, symptoms concerning for accelerating angina with onset last Sunday. Recent ECG normal. Cardiac risk factors include age and gender, family history of CAD in male relatives, also hyperlipidemia. He does report some recent tick bites and  was placed prophylactically on doxycycline at [facility]. He has not undergone any interval ischemic evaluation since 2005. He is very worried about his symptoms, particular in light of family history. We discussed options for further cardiac assessment including both noninvasive and invasive techniques. After reviewing the risks and benefits he is in agreement to proceed with a diagnostic cardiac catheterization. He will take a baby aspirin daily for now, we will obtain preprocedure chest x-ray and lab work.  2. History of hyperlipidemia, follows lab work with Dr. Pleas Koch at [facility]. Currently not on statin therapy.  3. History of Crohn's disease affecting small bowel status post surgery at [facility] in the past. Reports no recent symptoms although does mention some blood in the stool with taking Excedrin recently. Potential increased risk of GI bleeding needs to be considered if he undergoes PCI with need for dual antiplatelet therapy.  4. History of  hypothyroidism, on Synthroid.  Current medicines were reviewed with the patient today.   Orders Placed This Encounter  Procedures  . DG Chest 2 View  . CBC  . Basic metabolic panel  . APTT  . Protime-INR    Disposition: Follow-up after cardiac catheterization.  Signed, Satira Sark, MD, [facility] 04/08/2017 10:17 AM    [facility], [place], [place] [address] Phone: [phone]; Fax: [phone]

## 2017-04-10 NOTE — Research (Signed)
OPTIMIZE Informed Consent   Subject Name: BARDIA WANGERIN  Subject met inclusion and exclusion criteria.  The informed consent form, study requirements and expectations were reviewed with the subject and questions and concerns were addressed prior to the signing of the consent form.  The subject verbalized understanding of the trial requirements.  The subject agreed to participate in the OPTIMIZE trial and signed the informed consent.  The informed consent was obtained prior to performance of any protocol-specific procedures for the subject.  A copy of the signed informed consent was given to the subject and a copy was placed in the subject's medical record.  Blossom Hoops 04/10/2017, 2:38 PM

## 2017-04-10 NOTE — Interval H&P Note (Signed)
History and Physical Interval Note: Cath Lab Visit (complete for each Cath Lab visit)  Clinical Evaluation Leading to the Procedure:   ACS: No.  Non-ACS:    Anginal Classification: CCS III  Anti-ischemic medical therapy: No Therapy  Non-Invasive Test Results: No non-invasive testing performed  Prior CABG: No previous CABG       04/10/2017 2:43 PM  [facility]  has presented today for surgery, with the diagnosis of angina  The various methods of treatment have been discussed with the patient and family. After consideration of risks, benefits and other options for treatment, the patient has consented to  Procedure(s): LEFT HEART CATH AND CORONARY ANGIOGRAPHY (N/A) as a surgical intervention .  The patient's history has been reviewed, patient examined, no change in status, stable for surgery.  I have reviewed the patient's chart and labs.  Questions were answered to the patient's satisfaction.     [name]

## 2017-04-11 ENCOUNTER — Encounter (HOSPITAL_COMMUNITY): Payer: Self-pay | Admitting: Interventional Cardiology

## 2017-04-15 DIAGNOSIS — E039 Hypothyroidism, unspecified: Secondary | ICD-10-CM | POA: Diagnosis not present

## 2017-05-02 ENCOUNTER — Telehealth: Payer: Self-pay | Admitting: Internal Medicine

## 2017-05-02 NOTE — Telephone Encounter (Signed)
RECALL FOR BONE DENSITY

## 2017-05-02 NOTE — Telephone Encounter (Signed)
Letter mailed

## 2017-05-08 ENCOUNTER — Emergency Department (HOSPITAL_COMMUNITY)
Admission: EM | Admit: 2017-05-08 | Discharge: 2017-05-08 | Disposition: A | Discharge status: Home / Self Care | Payer: Worker's Compensation | Attending: Emergency Medicine | Admitting: Emergency Medicine

## 2017-05-08 ENCOUNTER — Encounter (HOSPITAL_COMMUNITY): Payer: Self-pay

## 2017-05-08 ENCOUNTER — Emergency Department (HOSPITAL_COMMUNITY): Payer: Worker's Compensation

## 2017-05-08 DIAGNOSIS — K5000 Crohn's disease of small intestine without complications: Secondary | ICD-10-CM | POA: Diagnosis not present

## 2017-05-08 DIAGNOSIS — Y9389 Activity, other specified: Secondary | ICD-10-CM | POA: Diagnosis not present

## 2017-05-08 DIAGNOSIS — Y998 Other external cause status: Secondary | ICD-10-CM | POA: Diagnosis not present

## 2017-05-08 DIAGNOSIS — Z79899 Other long term (current) drug therapy: Secondary | ICD-10-CM | POA: Insufficient documentation

## 2017-05-08 DIAGNOSIS — Y92410 Unspecified street and highway as the place of occurrence of the external cause: Secondary | ICD-10-CM | POA: Insufficient documentation

## 2017-05-08 DIAGNOSIS — S299XXA Unspecified injury of thorax, initial encounter: Secondary | ICD-10-CM | POA: Diagnosis present

## 2017-05-08 DIAGNOSIS — S8012XA Contusion of left lower leg, initial encounter: Secondary | ICD-10-CM

## 2017-05-08 DIAGNOSIS — Z7982 Long term (current) use of aspirin: Secondary | ICD-10-CM | POA: Insufficient documentation

## 2017-05-08 DIAGNOSIS — S2020XA Contusion of thorax, unspecified, initial encounter: Secondary | ICD-10-CM | POA: Insufficient documentation

## 2017-05-08 DIAGNOSIS — S20219A Contusion of unspecified front wall of thorax, initial encounter: Secondary | ICD-10-CM

## 2017-05-08 LAB — I-STAT TROPONIN, ED: TROPONIN I, POC: 0.01 ng/mL (ref 0.00–0.08)

## 2017-05-08 LAB — CBC
HCT: 40.9 % (ref 39.0–52.0)
HEMOGLOBIN: 13.1 g/dL (ref 13.0–17.0)
MCH: 27.1 pg (ref 26.0–34.0)
MCHC: 32 g/dL (ref 30.0–36.0)
MCV: 84.7 fL (ref 78.0–100.0)
PLATELETS: 232 10*3/uL (ref 150–400)
RBC: 4.83 MIL/uL (ref 4.22–5.81)
RDW: 17.1 % — ABNORMAL HIGH (ref 11.5–15.5)
WBC: 12.9 10*3/uL — ABNORMAL HIGH (ref 4.0–10.5)

## 2017-05-08 LAB — BASIC METABOLIC PANEL
Anion gap: 8 (ref 5–15)
BUN: 7 mg/dL (ref 6–20)
CALCIUM: 9.2 mg/dL (ref 8.9–10.3)
CHLORIDE: 108 mmol/L (ref 101–111)
CO2: 22 mmol/L (ref 22–32)
Creatinine, Ser: 1.04 mg/dL (ref 0.61–1.24)
GFR calc Af Amer: >60 mL/min (ref >60)
GFR calc non Af Amer: >60 mL/min (ref >60)
GLUCOSE: 93 mg/dL (ref 65–99)
POTASSIUM: 3.7 mmol/L (ref 3.5–5.1)
Sodium: 138 mmol/L (ref 135–145)

## 2017-05-08 MED ORDER — ACETAMINOPHEN 500 MG PO TABS: 1000.0000 mg | ORAL_TABLET | Freq: Four times a day (QID) | ORAL | 0 refills | Status: DC | PRN

## 2017-05-08 MED ORDER — ORPHENADRINE CITRATE ER 100 MG PO TB12: 100.0000 mg | ORAL_TABLET | Freq: Two times a day (BID) | ORAL | 0 refills | Status: DC

## 2017-05-08 MED ORDER — ACETAMINOPHEN 500 MG PO TABS
1000.0000 mg | ORAL_TABLET | Freq: Once | ORAL | Status: AC
  Administered 2017-05-08: 1000 mg via ORAL
  Filled 2017-05-08: qty 2

## 2017-05-08 MED ORDER — CYCLOBENZAPRINE HCL 10 MG PO TABS
10.0000 mg | ORAL_TABLET | Freq: Once | ORAL | Status: AC
  Administered 2017-05-08: 10 mg via ORAL
  Filled 2017-05-08: qty 1
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
## 2017-05-09 ENCOUNTER — Ambulatory Visit: Payer: 59 | Admitting: Cardiology

## 2017-07-10 DIAGNOSIS — K5090 Crohn's disease, unspecified, without complications: Secondary | ICD-10-CM | POA: Diagnosis not present

## 2017-07-10 DIAGNOSIS — R5383 Other fatigue: Secondary | ICD-10-CM | POA: Diagnosis not present

## 2017-07-10 DIAGNOSIS — E039 Hypothyroidism, unspecified: Secondary | ICD-10-CM | POA: Diagnosis not present

## 2017-07-11 ENCOUNTER — Encounter: Payer: Self-pay | Admitting: Internal Medicine

## 2017-07-15 DIAGNOSIS — M1712 Unilateral primary osteoarthritis, left knee: Secondary | ICD-10-CM | POA: Diagnosis not present

## 2017-07-15 DIAGNOSIS — K5090 Crohn's disease, unspecified, without complications: Secondary | ICD-10-CM | POA: Diagnosis not present

## 2017-07-15 DIAGNOSIS — E039 Hypothyroidism, unspecified: Secondary | ICD-10-CM | POA: Diagnosis not present

## 2017-08-19 DIAGNOSIS — M1711 Unilateral primary osteoarthritis, right knee: Secondary | ICD-10-CM | POA: Diagnosis not present

## 2017-08-19 DIAGNOSIS — E039 Hypothyroidism, unspecified: Secondary | ICD-10-CM | POA: Diagnosis not present

## 2017-08-19 DIAGNOSIS — M1712 Unilateral primary osteoarthritis, left knee: Secondary | ICD-10-CM | POA: Diagnosis not present

## 2017-08-26 DIAGNOSIS — M1712 Unilateral primary osteoarthritis, left knee: Secondary | ICD-10-CM | POA: Diagnosis not present

## 2017-08-26 DIAGNOSIS — M1711 Unilateral primary osteoarthritis, right knee: Secondary | ICD-10-CM | POA: Diagnosis not present

## 2017-08-26 DIAGNOSIS — E039 Hypothyroidism, unspecified: Secondary | ICD-10-CM | POA: Diagnosis not present

## 2017-08-26 DIAGNOSIS — L570 Actinic keratosis: Secondary | ICD-10-CM | POA: Diagnosis not present

## 2017-08-26 DIAGNOSIS — D235 Other benign neoplasm of skin of trunk: Secondary | ICD-10-CM | POA: Diagnosis not present

## 2017-09-02 DIAGNOSIS — M1711 Unilateral primary osteoarthritis, right knee: Secondary | ICD-10-CM | POA: Diagnosis not present

## 2017-09-02 DIAGNOSIS — E039 Hypothyroidism, unspecified: Secondary | ICD-10-CM | POA: Diagnosis not present

## 2017-09-02 DIAGNOSIS — M1712 Unilateral primary osteoarthritis, left knee: Secondary | ICD-10-CM | POA: Diagnosis not present

## 2017-09-16 ENCOUNTER — Ambulatory Visit: Payer: 59 | Admitting: Nurse Practitioner

## 2017-09-16 ENCOUNTER — Encounter: Payer: Self-pay | Admitting: Nurse Practitioner

## 2017-09-16 VITALS — BP 147/85 | HR 70 | Temp 96.9°F | Ht 72.0 in | Wt 187.8 lb

## 2017-09-16 DIAGNOSIS — K50919 Crohn's disease, unspecified, with unspecified complications: Secondary | ICD-10-CM

## 2017-09-16 DIAGNOSIS — E638 Other specified nutritional deficiencies: Secondary | ICD-10-CM | POA: Diagnosis not present
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
## 2017-09-16 NOTE — Assessment & Plan Note (Signed)
Crohn's disease currently well managed.  Colonoscopy was found to have some fibrosis at the anastomosis point, otherwise continues long-term surgical remission.  He is up-to-date on DEXA scan, TSH managed by primary care, nutritional deficiencies doing well.  Recent labs from primary care show essentially normal CBC, CMP.  His liver enzyme elevation has normalized.  Overall, he appears to be doing quite well.  He feels good and is generally asymptomatic.  Recommend notify us when he is needing a Lialda refill.  Continue Lialda.  Return for follow-up in 6 months for follow-up and labs

## 2017-09-16 NOTE — Patient Instructions (Signed)
1. Continue your current medications. 2. Continue your current supplements including vitamin D, calcium, vitamin B12. 3. I am providing you information related to diverticula, which was found on your colonoscopy 4. Notify us if/when you need a refill of the [place]. 5. Return for follow-up in 6 months. 6. Call us if you have any questions or concerns.

## 2017-09-16 NOTE — Progress Notes (Signed)
CC'D TO PCP °

## 2017-09-16 NOTE — Assessment & Plan Note (Signed)
Most recent vitamin D and B12 levels normal.  Recommend continue supplementation and injections.  Bone density scan with significant improvement on calcium supplementation.  This is just completed in 2017 and not due for another 5 years.  Recommend he follow-up in 6 months for updated labs and follow-up.

## 2017-10-31 DIAGNOSIS — R5383 Other fatigue: Secondary | ICD-10-CM | POA: Diagnosis not present

## 2017-10-31 DIAGNOSIS — E039 Hypothyroidism, unspecified: Secondary | ICD-10-CM | POA: Diagnosis not present

## 2017-10-31 DIAGNOSIS — K5090 Crohn's disease, unspecified, without complications: Secondary | ICD-10-CM | POA: Diagnosis not present

## 2017-11-05 DIAGNOSIS — Z0001 Encounter for general adult medical examination with abnormal findings: Secondary | ICD-10-CM | POA: Diagnosis not present

## 2017-11-11 DIAGNOSIS — I77819 Aortic ectasia, unspecified site: Secondary | ICD-10-CM | POA: Diagnosis not present

## 2017-11-11 DIAGNOSIS — I351 Nonrheumatic aortic (valve) insufficiency: Secondary | ICD-10-CM | POA: Diagnosis not present

## 2017-11-11 DIAGNOSIS — R011 Cardiac murmur, unspecified: Secondary | ICD-10-CM | POA: Diagnosis not present

## 2017-12-06 ENCOUNTER — Other Ambulatory Visit: Payer: Self-pay | Admitting: Internal Medicine

## 2018-02-12 DIAGNOSIS — D519 Vitamin B12 deficiency anemia, unspecified: Secondary | ICD-10-CM | POA: Diagnosis not present

## 2018-02-12 DIAGNOSIS — E039 Hypothyroidism, unspecified: Secondary | ICD-10-CM | POA: Diagnosis not present

## 2018-02-12 DIAGNOSIS — E559 Vitamin D deficiency, unspecified: Secondary | ICD-10-CM | POA: Diagnosis not present

## 2018-02-19 DIAGNOSIS — D235 Other benign neoplasm of skin of trunk: Secondary | ICD-10-CM | POA: Diagnosis not present

## 2018-02-19 DIAGNOSIS — D485 Neoplasm of uncertain behavior of skin: Secondary | ICD-10-CM | POA: Diagnosis not present

## 2018-02-19 DIAGNOSIS — L570 Actinic keratosis: Secondary | ICD-10-CM | POA: Diagnosis not present

## 2018-02-19 DIAGNOSIS — D225 Melanocytic nevi of trunk: Secondary | ICD-10-CM | POA: Diagnosis not present

## 2018-02-25 DIAGNOSIS — M1712 Unilateral primary osteoarthritis, left knee: Secondary | ICD-10-CM | POA: Diagnosis not present

## 2018-02-25 DIAGNOSIS — M1711 Unilateral primary osteoarthritis, right knee: Secondary | ICD-10-CM | POA: Diagnosis not present

## 2018-02-25 DIAGNOSIS — R011 Cardiac murmur, unspecified: Secondary | ICD-10-CM | POA: Diagnosis not present

## 2018-03-17 ENCOUNTER — Ambulatory Visit: Payer: 59 | Admitting: Nurse Practitioner

## 2018-05-12 DIAGNOSIS — Z13220 Encounter for screening for lipoid disorders: Secondary | ICD-10-CM | POA: Diagnosis not present

## 2018-05-12 DIAGNOSIS — E039 Hypothyroidism, unspecified: Secondary | ICD-10-CM | POA: Diagnosis not present

## 2018-05-12 DIAGNOSIS — R5383 Other fatigue: Secondary | ICD-10-CM | POA: Diagnosis not present

## 2018-05-12 DIAGNOSIS — K5090 Crohn's disease, unspecified, without complications: Secondary | ICD-10-CM | POA: Diagnosis not present

## 2018-05-15 DIAGNOSIS — E039 Hypothyroidism, unspecified: Secondary | ICD-10-CM | POA: Diagnosis not present

## 2018-05-15 DIAGNOSIS — R011 Cardiac murmur, unspecified: Secondary | ICD-10-CM | POA: Diagnosis not present

## 2018-05-15 DIAGNOSIS — M1711 Unilateral primary osteoarthritis, right knee: Secondary | ICD-10-CM | POA: Diagnosis not present

## 2018-05-15 DIAGNOSIS — Z1389 Encounter for screening for other disorder: Secondary | ICD-10-CM | POA: Diagnosis not present

## 2018-05-20 DIAGNOSIS — I861 Scrotal varices: Secondary | ICD-10-CM | POA: Diagnosis not present

## 2018-05-20 DIAGNOSIS — N503 Cyst of epididymis: Secondary | ICD-10-CM | POA: Diagnosis not present

## 2018-05-20 DIAGNOSIS — N433 Hydrocele, unspecified: Secondary | ICD-10-CM | POA: Diagnosis not present

## 2018-08-05 DIAGNOSIS — K5090 Crohn's disease, unspecified, without complications: Secondary | ICD-10-CM | POA: Diagnosis not present

## 2018-08-05 DIAGNOSIS — E039 Hypothyroidism, unspecified: Secondary | ICD-10-CM | POA: Diagnosis not present

## 2018-08-05 DIAGNOSIS — R5383 Other fatigue: Secondary | ICD-10-CM | POA: Diagnosis not present

## 2018-08-08 DIAGNOSIS — R011 Cardiac murmur, unspecified: Secondary | ICD-10-CM | POA: Diagnosis not present

## 2018-08-08 DIAGNOSIS — E039 Hypothyroidism, unspecified: Secondary | ICD-10-CM | POA: Diagnosis not present

## 2018-08-08 DIAGNOSIS — M1712 Unilateral primary osteoarthritis, left knee: Secondary | ICD-10-CM | POA: Diagnosis not present

## 2018-08-18 ENCOUNTER — Encounter: Payer: Self-pay | Admitting: Internal Medicine

## 2018-08-22 DIAGNOSIS — Z6825 Body mass index (BMI) 25.0-25.9, adult: Secondary | ICD-10-CM | POA: Diagnosis not present

## 2018-08-22 DIAGNOSIS — K1120 Sialoadenitis, unspecified: Secondary | ICD-10-CM | POA: Diagnosis not present

## 2018-09-08 DIAGNOSIS — L570 Actinic keratosis: Secondary | ICD-10-CM | POA: Diagnosis not present

## 2018-09-08 DIAGNOSIS — D1801 Hemangioma of skin and subcutaneous tissue: Secondary | ICD-10-CM | POA: Diagnosis not present

## 2018-09-08 DIAGNOSIS — D235 Other benign neoplasm of skin of trunk: Secondary | ICD-10-CM | POA: Diagnosis not present

## 2018-09-23 ENCOUNTER — Ambulatory Visit: Payer: 59 | Admitting: Internal Medicine

## 2018-09-23 ENCOUNTER — Encounter: Payer: Self-pay | Admitting: Internal Medicine

## 2018-09-23 VITALS — BP 153/86 | HR 67 | Temp 96.8°F | Ht 72.0 in | Wt 178.6 lb

## 2018-09-23 DIAGNOSIS — K5010 Crohn's disease of large intestine without complications: Secondary | ICD-10-CM

## 2018-09-23 NOTE — Progress Notes (Signed)
Primary Care Physician:  Curlene Labrum, MD Primary Gastroenterologist:  Dr. [name]  Pre-Procedure History & Physical: HPI:  Bobby Gross is a 63 y.o. male here for follow-up of ileocolonic Crohn's disease.  Surgical remission after resection at [facility] many many years ago.  Colonoscopy 2 years ago demonstrated quiescent disease.  Maintained on Lialda 4.8 g daily.  No diarrhea or abdominal pain no rectal bleeding.  No NSAIDs.  Takes calcium vitamin D supplement.  Normal bone density.  [place] Is [place] from the city.  Past Medical History:  Diagnosis Date  . B12 deficiency    Following small bowel resection  . Crohn's disease ([place])    Involving small bowel  . History of nephrolithiasis   . Hyperlipemia     Past Surgical History:  Procedure Laterality Date  . BIOPSY  01/31/2017   Procedure: BIOPSY;  Surgeon: [name], MD;  [facility];  Service: Endoscopy;;  biopsy    . COLONOSCOPY  10/03/2006   Normal rectum/Status post right hemicolectomy with a small bowel anastomosis,  fibrotic-appearing anastomosis consistent with Crohn's, status post  biopsy, but overall, things look really good today  . COLONOSCOPY  01/28/2012   Dr. Sabino Gasser R hemicolectomy for ileocolonic crohn's disease. disease appears inactive at this time.  all Bx's benign.  . COLONOSCOPY N/A 01/31/2017   Procedure: COLONOSCOPY;  Surgeon: [name], MD;  [facility];  Service: Endoscopy;  Laterality: N/A;  7:30am  . DEXA scan  09/2010  . LEFT HEART CATH AND CORONARY ANGIOGRAPHY N/A 04/10/2017   Procedure: LEFT HEART CATH AND CORONARY ANGIOGRAPHY;  Surgeon: Belva Crome, MD;  Location: [place] [facility];  Service: Cardiovascular;  Laterality: N/A;  . RIGHT FOOT SURGER  2009   PLANTAR FASCITIS  . RIGHT SHOULDER SURGERY    . RIGHT WRIST SURGERY    . [place]    Prior to Admission medications   Medication Sig Start Date End Date Taking?  Authorizing Provider  CALCIUM-VITAMIN D PO Take by mouth. Takes 3-4 times/week   Yes [provider]  cetirizine (ZYRTEC) 10 MG tablet Take 10 mg by mouth daily as needed for allergies.   Yes [provider]  cyanocobalamin (,VITAMIN B-12,) 1000 MCG/ML injection Inject 1 mL (1,000 mcg total) into the muscle every 30 (thirty) days. 12/11/16  Yes , [name], MD  levothyroxine (SYNTHROID, LEVOTHROID) 50 MCG tablet Take 1 tablet by mouth daily. 09/22/18  Yes [provider]  LIALDA 1.2 g EC tablet TAKE 4 TABLETS BY MOUTH  DAILY 12/08/17  Yes Mahala Menghini, PA-C  Multiple Vitamin (MULTIVITAMIN WITH MINERALS) TABS Take 1 tablet by mouth daily.   Yes [provider]  acetaminophen (TYLENOL) 500 MG tablet Take 2 tablets (1,000 mg total) by mouth every 6 (six) hours as needed. Patient not taking: Reported on 09/23/2018 05/08/17   Charlesetta Shanks, MD  levothyroxine (SYNTHROID, LEVOTHROID) 25 MCG tablet Take 25 mcg by mouth daily before breakfast.     [provider]    Allergies as of 09/23/2018  . (No Known Allergies)    Family History  Problem Relation Age of Onset  . Colon cancer Other     Social History   Socioeconomic History  . Marital status: Married    Spouse name: Not on file  . Number of children: Not on file  . Years of education: Not on file  .  Highest education level: Not on file  Occupational History  . Not on file  Social Needs  . Financial resource strain: Not on file  . Food insecurity:    Worry: Not on file    Inability: Not on file  . Transportation needs:    Medical: Not on file    Non-medical: Not on file  Tobacco Use  . Smoking status: Never Smoker  . Smokeless tobacco: Never Used  . Tobacco comment: Never smoker  Substance and Sexual Activity  . Alcohol use: No  . Drug use: No  . Sexual activity: Not on file  Lifestyle  . Physical activity:    Days per week: Not on file    Minutes per session: Not on file    . Stress: Not on file  Relationships  . Social connections:    Talks on phone: Not on file    Gets together: Not on file    Attends religious service: Not on file    Active member of club or organization: Not on file    Attends meetings of clubs or organizations: Not on file    Relationship status: Not on file  . Intimate partner violence:    Fear of current or ex partner: Not on file    Emotionally abused: Not on file    Physically abused: Not on file    Forced sexual activity: Not on file  Other Topics Concern  . Not on file  Social History Narrative  . Not on file    Review of Systems: See HPI, otherwise negative ROS  Physical Exam: BP (!) 153/86   Pulse 67   Temp (!) 96.8 F (36 C) (Oral)   Ht 6' (1.829 m)   Wt 178 lb 9.6 oz (81 kg)   BMI 24.22 kg/m  General:   Alert,   pleasant and cooperative in NAD Neck:  Supple; no masses or thyromegaly. No significant cervical adenopathy. Lungs:  Clear throughout to auscultation.   No wheezes, crackles, or rhonchi. No acute distress. Heart:  Regular rate and rhythm; no murmurs, clicks, rubs,  or gallops. Abdomen: Non-distended, normal bowel sounds.  Soft and nontender without appreciable mass or hepatosplenomegaly.  Pulses:  Normal pulses noted. Extremities:  Without clubbing or edema.  Impression/Plan: 63 year old gentleman with ileocolonic Crohn's disease.  He remains in remission on mesalamine.  At this time, I feel the benefits of ongoing mesalamine therapy outweigh the theoretical risk.  He is tolerating the regimen very well.  Reminded him about the importance of avoiding NSAIDs as much as possible.  Recommendations: Continue Lialda 4.8 grams daily  Continue Ca / Vitamin D supplement  Office visit in 1 year  Repeat colonoscopy in about 3 years [place] bone density assessment..        Notice: This dictation was prepared with Dragon dictation along with smaller phrase technology. Any transcriptional errors that  result from this process are unintentional and may not be corrected upon review.

## 2018-09-23 NOTE — Patient Instructions (Signed)
Continue Lialda 4.8 grams daily  Continue Ca / Vitamin D supplement  Office visit in 1 year

## 2018-09-29 ENCOUNTER — Other Ambulatory Visit: Payer: Self-pay | Admitting: Internal Medicine

## 2018-12-04 ENCOUNTER — Telehealth: Payer: Self-pay | Admitting: Internal Medicine

## 2018-12-04 NOTE — Telephone Encounter (Signed)
Recall for bone density

## 2018-12-04 NOTE — Telephone Encounter (Signed)
Recall sent 

## 2019-01-04 ENCOUNTER — Other Ambulatory Visit: Payer: Self-pay | Admitting: Gastroenterology

## 2019-01-12 ENCOUNTER — Other Ambulatory Visit: Payer: Self-pay

## 2019-01-13 MED ORDER — MESALAMINE 1.2 G PO TBEC: 4.8000 g | DELAYED_RELEASE_TABLET | Freq: Every day | ORAL | 11 refills | Status: DC

## 2019-04-25 IMAGING — DX DG CHEST 2V
2 series · 2 of 2 positions shown · non-contrast
Comparison: None in PACs

CLINICAL DATA: Thoracic aortic atherosclerosis.

EXAM:
CHEST  2 VIEW

[chest pa]
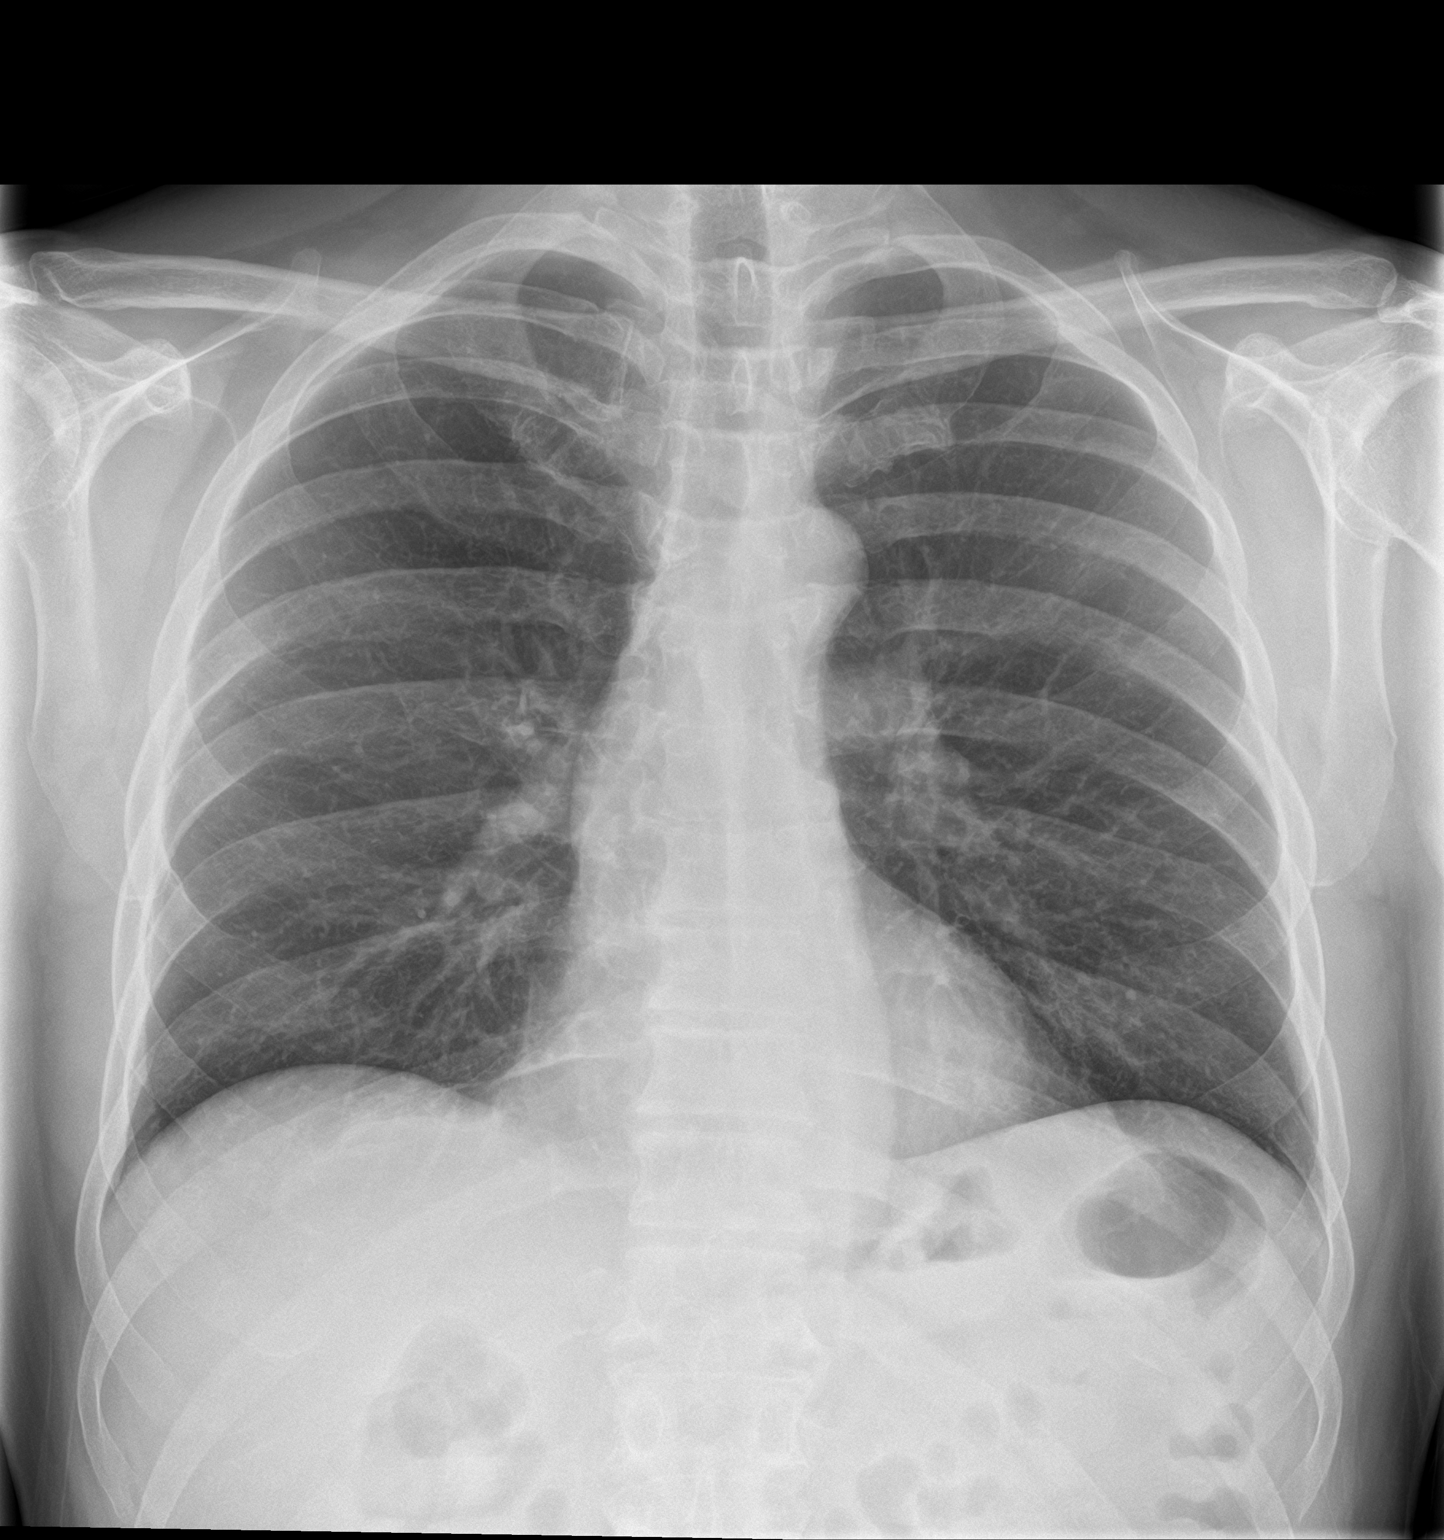

[chest lat]
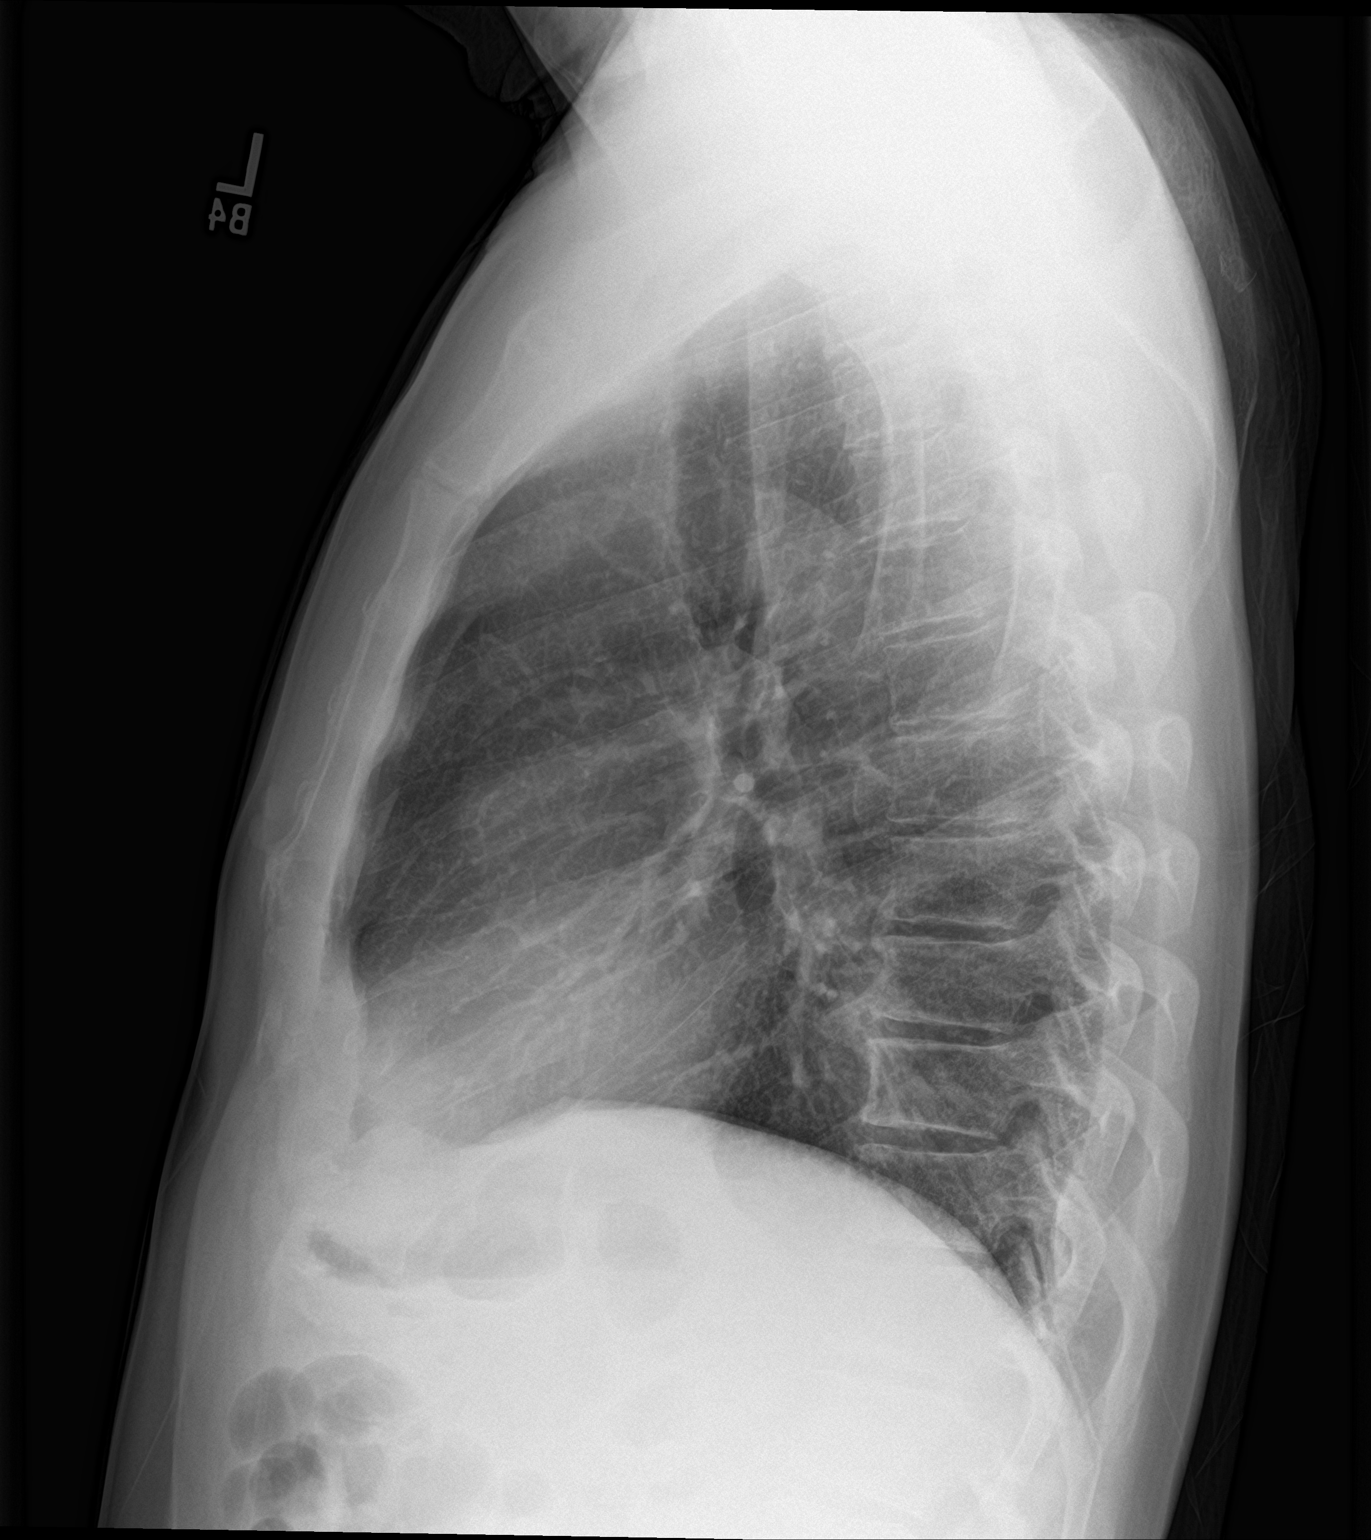

[2 of 2 positions shown; findings below may reference images not displayed]

FINDINGS: The lungs are adequately inflated and clear. The heart and pulmonary
vascularity are normal. The mediastinum is normal in width. There is
faint calcification in the wall of the aortic arch. There is no
pleural effusion. The bony thorax exhibits no acute abnormality.
IMPRESSION: There is no active cardiopulmonary disease.

## 2019-07-31 ENCOUNTER — Other Ambulatory Visit: Payer: Self-pay | Admitting: Gastroenterology

## 2019-09-02 ENCOUNTER — Encounter: Payer: Self-pay | Admitting: Internal Medicine

## 2019-10-13 ENCOUNTER — Encounter: Payer: Self-pay | Admitting: Internal Medicine

## 2019-10-13 ENCOUNTER — Other Ambulatory Visit: Payer: Self-pay

## 2019-10-13 ENCOUNTER — Ambulatory Visit: Payer: 59 | Admitting: Internal Medicine

## 2019-10-13 VITALS — BP 155/84 | HR 65 | Temp 97.1°F | Ht 72.0 in | Wt 183.2 lb

## 2019-10-13 DIAGNOSIS — K5010 Crohn's disease of large intestine without complications: Secondary | ICD-10-CM | POA: Diagnosis not present

## 2019-10-13 NOTE — Patient Instructions (Signed)
Continue Lialda daily  Continue B12, Calcium / Vitamin D supplementation  Office visit in 1 year  Colonoscopy 2023

## 2019-10-15 NOTE — Progress Notes (Signed)
Primary Care Physician:  Curlene Labrum, MD Primary Gastroenterologist:  Dr. [name]  Pre-Procedure History & Physical: HPI:  Bobby Gross is a 64 y.o. male here for follow-up of ileocolonic Crohn's disease.  He continues to do very well.  Good appetite no abdominal pain no diarrhea.  He continues on Lialda.  He continues taking B12 calcium and vitamin D supplementation.  He will be due for surveillance colonoscopy 2022.  Will be due for bone density assessment 2022 as well. Past Medical History:  Diagnosis Date  . B12 deficiency    Following small bowel resection  . Crohn's disease ([place])    Involving small bowel  . History of nephrolithiasis   . Hyperlipemia     Past Surgical History:  Procedure Laterality Date  . BIOPSY  01/31/2017   Procedure: BIOPSY;  Surgeon: [name], MD;  [facility];  Service: Endoscopy;;  biopsy    . COLONOSCOPY  10/03/2006   Normal rectum/Status post right hemicolectomy with a small bowel anastomosis,  fibrotic-appearing anastomosis consistent with Crohn's, status post  biopsy, but overall, things look really good today  . COLONOSCOPY  01/28/2012   Dr. Sabino Gasser R hemicolectomy for ileocolonic crohn's disease. disease appears inactive at this time.  all Bx's benign.  . COLONOSCOPY N/A 01/31/2017   Procedure: COLONOSCOPY;  Surgeon: [name], MD;  [facility];  Service: Endoscopy;  Laterality: N/A;  7:30am  . DEXA scan  09/2010  . LEFT HEART CATH AND CORONARY ANGIOGRAPHY N/A 04/10/2017   Procedure: LEFT HEART CATH AND CORONARY ANGIOGRAPHY;  Surgeon: Belva Crome, MD;  Location: [facility];  Service: Cardiovascular;  Laterality: N/A;  . RIGHT FOOT SURGER  2009   PLANTAR FASCITIS  . RIGHT SHOULDER SURGERY    . RIGHT WRIST SURGERY    . [place]    Prior to Admission medications   Medication Sig Start Date End Date Taking? Authorizing Provider  acetaminophen (TYLENOL) 500 MG  tablet Take 2 tablets (1,000 mg total) by mouth every 6 (six) hours as needed. 05/08/17  Yes Charlesetta Shanks, MD  CALCIUM-VITAMIN D PO Take by mouth. Takes 3-4 times/week   Yes [provider]  cetirizine (ZYRTEC) 10 MG tablet Take 10 mg by mouth daily as needed for allergies.   Yes [provider]  cyanocobalamin (,VITAMIN B-12,) 1000 MCG/ML injection inject ONE ML into THE muscle every 30 DAYS 08/07/19  Yes Erenest Rasher, PA-C  levothyroxine (SYNTHROID, LEVOTHROID) 25 MCG tablet Take 25 mcg by mouth daily before breakfast.    Yes [provider]  mesalamine (LIALDA) 1.2 g EC tablet Take 4 tablets (4.8 g total) by mouth daily. 01/13/19  Yes Mahala Menghini, PA-C  Multiple Vitamin (MULTIVITAMIN WITH MINERALS) TABS Take 1 tablet by mouth daily.   Yes [provider]  tamsulosin (FLOMAX) 0.4 MG CAPS capsule Take 0.4 mg by mouth daily. 10/12/19  Yes [provider]  levothyroxine (SYNTHROID, LEVOTHROID) 50 MCG tablet Take 1 tablet by mouth daily. 09/22/18   [provider]    Allergies as of 10/13/2019  . (No Known Allergies)    Family History  Problem Relation Age of Onset  . Colon cancer Other     Social History   Socioeconomic History  . Marital status: Married    Spouse name: Not on file  . Number of children: Not on file  . Years of education:  Not on file  . Highest education level: Not on file  Occupational History  . Not on file  Tobacco Use  . Smoking status: Never Smoker  . Smokeless tobacco: Never Used  . Tobacco comment: Never smoker  Substance and Sexual Activity  . Alcohol use: No  . Drug use: No  . Sexual activity: Not on file  Other Topics Concern  . Not on file  Social History Narrative  . Not on file   Social Determinants of Health   Financial Resource Strain:   . Difficulty of Paying Living Expenses:   Food Insecurity:   . Worried About Charity fundraiser in the Last Year:   . Arboriculturist in the  Last Year:   Transportation Needs:   . Film/video editor (Medical):   Marland Kitchen Lack of Transportation (Non-Medical):   Physical Activity:   . Days of Exercise per Week:   . Minutes of Exercise per Session:   Stress:   . Feeling of Stress :   Social Connections:   . Frequency of Communication with Friends and Family:   . Frequency of Social Gatherings with Friends and Family:   . Attends Religious Services:   . Active Member of Clubs or Organizations:   . Attends Archivist Meetings:   Marland Kitchen Marital Status:   Intimate Partner Violence:   . Fear of Current or Ex-Partner:   . Emotionally Abused:   Marland Kitchen Physically Abused:   . Sexually Abused:     Review of Systems: See HPI, otherwise negative ROS  Physical Exam: BP (!) 155/84   Pulse 65   Temp (!) 97.1 F (36.2 C) (Oral)   Ht 6' (1.829 m)   Wt 183 lb 3.2 oz (83.1 kg)   BMI 24.85 kg/m  General:   Alert,  Well-developed, well-nourished, pleasant and cooperative in NAD Lungs:  Clear throughout to auscultation.   No wheezes, crackles, or rhonchi. No acute distress. Heart:  Regular rate and rhythm; no murmurs, clicks, rubs,  or gallops. Abdomen: Non-distended, normal bowel sounds.  Soft and nontender without appreciable mass or hepatosplenomegaly.  Pulses:  Normal pulses noted. Extremities:  Without clubbing or edema.  Impression/Plan: Ileocolonic Crohn's disease in remission on Lialda.  Really long-term surgical remission since resection at [facility].  He does have stenosis at the anastomosis but it is not critical.  He is doing very well.  Recommendations: Continue Lialda daily  Continue B12, Calcium / Vitamin D supplementation  Office visit in 1 year  Colonoscopy 2022; bone density study next year as well.   Notice: This dictation was prepared with Dragon dictation along with smaller phrase technology. Any transcriptional errors that result from this process are unintentional and may not be corrected upon review.

## 2019-12-29 ENCOUNTER — Other Ambulatory Visit: Payer: Self-pay | Admitting: Gastroenterology

## 2020-03-09 DIAGNOSIS — I358 Other nonrheumatic aortic valve disorders: Secondary | ICD-10-CM

## 2020-03-09 HISTORY — DX: Other nonrheumatic aortic valve disorders: I35.8

## 2020-05-30 ENCOUNTER — Other Ambulatory Visit: Payer: Self-pay | Admitting: Gastroenterology

## 2020-07-04 ENCOUNTER — Other Ambulatory Visit: Payer: Self-pay

## 2020-07-04 ENCOUNTER — Encounter (HOSPITAL_COMMUNITY): Payer: Self-pay | Admitting: Physical Therapy

## 2020-07-04 ENCOUNTER — Ambulatory Visit (HOSPITAL_COMMUNITY): Payer: 59 | Attending: Neurological Surgery | Admitting: Physical Therapy

## 2020-07-04 DIAGNOSIS — M5412 Radiculopathy, cervical region: Secondary | ICD-10-CM | POA: Diagnosis present

## 2020-07-04 DIAGNOSIS — M542 Cervicalgia: Secondary | ICD-10-CM | POA: Insufficient documentation

## 2020-07-04 NOTE — Therapy (Signed)
[place], [place], [address] Phone: [phone]   Fax:  [phone]  Physical Therapy Evaluation  Patient Details  Name: Bobby Gross MRN: [id number] Date of Birth: Jun 03, 1956 Referring Provider (PT): Kristeen Miss MD    Encounter Date: 07/04/2020   PT End of Session - 07/04/20 1027    Visit Number 1    Number of Visits 8    Date for PT Re-Evaluation 08/05/20    Authorization Type [employer] (No auth, 60 VL)    Authorization - Visit Number 1    Authorization - Number of Visits 60    PT Start Time 0945    PT Stop Time 1030    PT Time Calculation (min) 45 min    Activity Tolerance Patient tolerated treatment well    Behavior During Therapy [facility] for tasks assessed/performed           Past Medical History:  Diagnosis Date  . B12 deficiency    Following small bowel resection  . Crohn's disease ([place])    Involving small bowel  . History of nephrolithiasis   . Hyperlipemia     Past Surgical History:  Procedure Laterality Date  . BIOPSY  01/31/2017   Procedure: BIOPSY;  Surgeon: [name], MD;  [facility];  Service: Endoscopy;;  biopsy    . COLONOSCOPY  10/03/2006   Normal rectum/Status post right hemicolectomy with a small bowel anastomosis,  fibrotic-appearing anastomosis consistent with Crohn's, status post  biopsy, but overall, things look really good today  . COLONOSCOPY  01/28/2012   Dr. Sabino Gasser R hemicolectomy for ileocolonic crohn's disease. disease appears inactive at this time.  all Bx's benign.  . COLONOSCOPY N/A 01/31/2017   Procedure: COLONOSCOPY;  Surgeon: [name], MD;  [facility];  Service: Endoscopy;  Laterality: N/A;  7:30am  . DEXA scan  09/2010  . LEFT HEART CATH AND CORONARY ANGIOGRAPHY N/A 04/10/2017   Procedure: LEFT HEART CATH AND CORONARY ANGIOGRAPHY;  Surgeon: Belva Crome, MD;  Location: [facility];  Service: Cardiovascular;   Laterality: N/A;  . RIGHT FOOT SURGER  2009   PLANTAR FASCITIS  . RIGHT SHOULDER SURGERY    . RIGHT WRIST SURGERY    . SMALL INTESTINE SURGERY  1988   AT [facility]    There were no vitals filed for this visit.    Subjective Assessment - 07/04/20 1002    Subjective Patient presents to physical therapy with complaint of RT arm pain and numbness. Patient says this began about a year ago. Noted some numbness starting in RT hand last year, this would come and go but never really went away. He says numbness became progressively worse and began to have some pain in RT shoulder area. Patient had consult with MD and MRI imaging which showed multi level disc degeneration in lower cervical spine. Patient says he is considering cervical disc replacement but wants to try therapy first. Denies taking medication for this issue. Has not had prior therapy for neck. Reports history of RT RCR x 2    Pertinent History RT RCR x 2    Limitations House hold activities;Sitting;Reading;Lifting    Diagnostic tests MRI    Patient Stated Goals Avoid surgery    Currently in Pain? Yes    Pain Score 2     Pain Location Shoulder    Pain Orientation Right    Pain Descriptors / Indicators Nagging  Pain Type Chronic pain    Pain Radiating Towards RT shoulder    Pain Onset More than a month ago    Pain Frequency Intermittent    Aggravating Factors  head movement    Pain Relieving Factors reclined position with arms out    Effect of Pain on Daily Activities Limits              [facility] PT Assessment - 07/04/20 0001      Assessment   Medical Diagnosis Cervical radiculopathy     Referring Provider (PT) Kristeen Miss MD     Next MD Visit --   09/18/20   Prior Therapy No       Precautions   Precautions None      Restrictions   Weight Bearing Restrictions No      Balance Screen   Has the patient fallen in the past 6 months No      [place] residence      Prior Function    Level of Independence Independent      Cognition   Overall Cognitive Status Within Functional Limits for tasks assessed      Observation/Other Assessments   Focus on Therapeutic Outcomes (FOTO)  Complete next session       Sensation   Light Touch Impaired by gross assessment   Decreased about C678 on RT      Posture/Postural Control   Posture/Postural Control Postural limitations    Postural Limitations Rounded Shoulders;Forward head      ROM / Strength   AROM / PROM / Strength AROM;Strength      AROM   Overall AROM Comments Bilateral shoulder AROM WFL     AROM Assessment Site Cervical    Cervical Flexion 50    Cervical Extension 38    Cervical - Right Side Bend 24    Cervical - Left Side Bend 26    Cervical - Right Rotation 58    Cervical - Left Rotation 58      Strength   Strength Assessment Site Shoulder;Elbow;Forearm    Right/Left Shoulder Right;Left    Right Shoulder Flexion 4+/5    Right Shoulder ABduction 5/5    Right Shoulder Internal Rotation 4+/5    Right Shoulder External Rotation 4+/5    Left Shoulder Flexion 5/5    Left Shoulder ABduction 5/5    Left Shoulder Internal Rotation 5/5    Left Shoulder External Rotation 5/5    Right/Left Elbow Right;Left    Right Elbow Flexion 5/5    Right Elbow Extension 5/5    Left Elbow Flexion 5/5                      Objective measurements completed on examination: See above findings.       [facility] Adult PT Treatment/Exercise - 07/04/20 0001      Exercises   Exercises Neck      Neck Exercises: Seated   Neck Retraction 5 reps    Other Seated Exercise posture correction x 5                  PT Education - 07/04/20 1006    Education Details on evaluation, POC and HEP    Person(s) Educated Patient    Methods Explanation;Handout    Comprehension Verbalized understanding            PT Short Term Goals - 07/04/20 1032      PT  SHORT TERM GOAL #1   Title Patient will be independent with  initial HEP and self-management strategies to improve functional outcomes    Time 2    Period Weeks    Status New    Target Date 07/22/20             PT Long Term Goals - 07/04/20 1032      PT LONG TERM GOAL #1   Title Patient will improve FOTO score by 10% to indicate improvement in functional outcomes    Time 4    Period Weeks    Status New    Target Date 08/05/20      PT LONG TERM GOAL #2   Title Patient will report at least 75% overall improvement in subjective complaint to indicate improvement in ability to perform ADLs.    Time 4    Period Weeks    Status New    Target Date 08/05/20      PT LONG TERM GOAL #3   Title Patient will demo improved posture and postural awareness and be independant with final HEP for improved functional outcomes.    Time 4    Period Weeks    Status New    Target Date 08/05/20                  Plan - 07/04/20 1028    Clinical Impression Statement Patient is 53 a y.o. male who presents to physical therapy with complaint of RT cervical radiculopathy. Patient demonstrates decreased strength, ROM restriction, reduced flexibility, and postural abnormalities which are likely contributing to symptoms of pain and are negatively impacting patient ability to perform ADLs. Patient will benefit from skilled physical therapy services to address these deficits to reduce pain and improve level of function with ADLs    Examination-Activity Limitations Reach Overhead;Lift;Sit;Other    Examination-Participation Restrictions Occupation;Yard Work;Cleaning;Community Activity    Stability/Clinical Decision Making Stable/Uncomplicated    Clinical Decision Making Low    Rehab Potential Good    PT Frequency 2x / week    PT Duration 4 weeks    PT Treatment/Interventions ADLs/Self Care Home Management;Aquatic Therapy;Biofeedback;Cryotherapy;Electrical Stimulation;Moist Heat;Iontophoresis 11m/ml Dexamethasone;Therapeutic activities;Therapeutic exercise;Manual  lymph drainage;Balance training;Traction;Manual techniques;Vasopneumatic Device;Taping;Splinting;Energy conservation;Orthotic Fit/Training;Stair training;Functional mobility training;Gait training;DME Instruction;Patient/family education;Passive range of motion;Joint Manipulations;Spinal Manipulations;Scar mobilization;Compression bandaging;Neuromuscular re-education;Ultrasound;Parrafin;Fluidtherapy;Contrast Bath;Dry needling    PT Next Visit Plan Complete FOTO. Review goals and HEP. Progress postural strength as tolerated. Review patients "golf stretches". Trial manual cervical traction if indicated.    PT Home Exercise Plan Eval: postural correction, chin tuck    Consulted and Agree with Plan of Care Patient           Patient will benefit from skilled therapeutic intervention in order to improve the following deficits and impairments:  Hypomobility, Impaired sensation, Decreased activity tolerance, Decreased strength, Pain, Impaired UE functional use, Decreased mobility, Postural dysfunction, Improper body mechanics, Decreased range of motion  Visit Diagnosis: Cervicalgia  Radiculopathy, cervical region     Problem List Patient Active Problem List   Diagnosis Date Noted  . Chest discomfort 04/10/2017  . Hyperlipidemia LDL goal <100 04/10/2017  . Family history of early CAD 04/10/2017  . Fatigue 04/10/2017  . Accelerating angina ([place]   . Screening for colon cancer 12/31/2011  . Other specified nutritional deficiencies 10/03/2009  . MIGRAINE HEADACHE 09/30/2009  . Regional enteritis ([facility] 09/30/2009   11:01 AM, 07/04/20 CJosue HectorPT DPT  Physical Therapist with [facility] [phone]  [place] [address] [place], [place], [address] Phone: [phone]   Fax:  [phone]  Name: DEITRICK FERRERI MRN: [id number] Date of Birth: Dec 15, 1955

## 2020-07-04 NOTE — Patient Instructions (Signed)
Access Code: [email or web address] URL: [email or web address] Date: 07/04/2020 Prepared by: Josue Hector  Exercises Correct Seated Posture - 3 x daily - 7 x weekly - 1-2 sets - 10 reps - 5 second hold Seated Cervical Retraction - 3 x daily - 7 x weekly - 1-2 sets - 10 reps - 5 second hold

## 2020-07-11 ENCOUNTER — Ambulatory Visit (HOSPITAL_COMMUNITY): Payer: 59 | Admitting: Physical Therapy

## 2020-07-11 ENCOUNTER — Encounter (HOSPITAL_COMMUNITY): Payer: Self-pay | Admitting: Physical Therapy

## 2020-07-11 ENCOUNTER — Other Ambulatory Visit: Payer: Self-pay

## 2020-07-11 DIAGNOSIS — M542 Cervicalgia: Secondary | ICD-10-CM | POA: Diagnosis not present

## 2020-07-11 DIAGNOSIS — M5412 Radiculopathy, cervical region: Secondary | ICD-10-CM

## 2020-07-11 NOTE — Patient Instructions (Signed)
Access Code: [email or web address] URL: [email or web address] Date: 07/11/2020 Prepared by: Mitzi Hansen   Exercises Supine Chin Tuck - 3 x daily - 7 x weekly - 2 sets - 10 reps - 5 second hold

## 2020-07-11 NOTE — Therapy (Signed)
[place], [place], [address] Phone: [phone]   Fax:  [phone]  Physical Therapy Treatment  Patient Details  Name: Bobby Gross MRN: [id number] Date of Birth: May 19, 1956 Referring Provider (PT): Kristeen Miss MD    Encounter Date: 07/11/2020   PT End of Session - 07/11/20 1401    Visit Number 2    Number of Visits 8    Date for PT Re-Evaluation 08/05/20    Authorization Type [employer] (No auth, 60 VL)    Authorization - Visit Number 2    Authorization - Number of Visits 60    PT Start Time 1401    PT Stop Time 1439    PT Time Calculation (min) 38 min    Activity Tolerance Patient tolerated treatment well    Behavior During Therapy [facility] for tasks assessed/performed           Past Medical History:  Diagnosis Date  . B12 deficiency    Following small bowel resection  . Crohn's disease ([place])    Involving small bowel  . History of nephrolithiasis   . Hyperlipemia     Past Surgical History:  Procedure Laterality Date  . BIOPSY  01/31/2017   Procedure: BIOPSY;  Surgeon: [name], MD;  [facility];  Service: Endoscopy;;  biopsy    . COLONOSCOPY  10/03/2006   Normal rectum/Status post right hemicolectomy with a small bowel anastomosis,  fibrotic-appearing anastomosis consistent with Crohn's, status post  biopsy, but overall, things look really good today  . COLONOSCOPY  01/28/2012   Dr. Sabino Gasser R hemicolectomy for ileocolonic crohn's disease. disease appears inactive at this time.  all Bx's benign.  . COLONOSCOPY N/A 01/31/2017   Procedure: COLONOSCOPY;  Surgeon: [name], MD;  [facility];  Service: Endoscopy;  Laterality: N/A;  7:30am  . DEXA scan  09/2010  . LEFT HEART CATH AND CORONARY ANGIOGRAPHY N/A 04/10/2017   Procedure: LEFT HEART CATH AND CORONARY ANGIOGRAPHY;  Surgeon: Belva Crome, MD;  Location: [place] [facility];  Service: Cardiovascular;   Laterality: N/A;  . RIGHT FOOT SURGER  2009   PLANTAR FASCITIS  . RIGHT SHOULDER SURGERY    . RIGHT WRIST SURGERY    . SMALL INTESTINE SURGERY  1988   AT [facility]    There were no vitals filed for this visit.   Subjective Assessment - 07/11/20 1402    Subjective Patient states his neck has not been too bad. He has not done much that aggravates his neck in the last week.    Pertinent History RT RCR x 2    Limitations House hold activities;Sitting;Reading;Lifting    Diagnostic tests MRI    Patient Stated Goals Avoid surgery    Currently in Pain? Yes    Pain Location Neck    Pain Descriptors / Indicators Other (Comment)   stiff   Pain Type Chronic pain    Pain Onset More than a month ago              [facility] PT Assessment - 07/11/20 0001      Observation/Other Assessments   Focus on Therapeutic Outcomes (FOTO)  40% limited                         [facility] Adult PT Treatment/Exercise - 07/11/20 0001      Neck Exercises: Theraband   Rows 15 reps;Blue  Neck Exercises: Standing   Neck Retraction 10 reps;5 secs    Neck Retraction Limitations with towel at wall    Other Standing Exercises pec stretch in doorway 3x 30 second      Neck Exercises: Seated   Neck Retraction 10 reps    Shoulder Shrugs --    Shoulder Shrugs Limitations --    Other Seated Exercise UT stretch R UE 2x 30 seconds                  PT Education - 07/11/20 1402    Education Details Patient educated on HEP, exercise mechanics    Person(s) Educated Patient    Methods Explanation;Demonstration    Comprehension Verbalized understanding;Returned demonstration            PT Short Term Goals - 07/04/20 1032      PT SHORT TERM GOAL #1   Title Patient will be independent with initial HEP and self-management strategies to improve functional outcomes    Time 2    Period Weeks    Status New    Target Date 07/22/20             PT Long Term Goals - 07/04/20 1032      PT LONG  TERM GOAL #1   Title Patient will improve FOTO score by 10% to indicate improvement in functional outcomes    Time 4    Period Weeks    Status New    Target Date 08/05/20      PT LONG TERM GOAL #2   Title Patient will report at least 75% overall improvement in subjective complaint to indicate improvement in ability to perform ADLs.    Time 4    Period Weeks    Status New    Target Date 08/05/20      PT LONG TERM GOAL #3   Title Patient will demo improved posture and postural awareness and be independant with final HEP for improved functional outcomes.    Time 4    Period Weeks    Status New    Target Date 08/05/20                 Plan - 07/11/20 1401    Clinical Impression Statement Completed FOTO which reveals 40% limitation. He requires verbal cueing to limit protraction after each rep of cervical retraction. He continues to state dull pain into R shoulder with cervical retraction. Patient demonstrates golf swing stretches which is basically a golf swing with resistance band. Patient does not feel radicular symptoms in R shoulder when completing retractions in supine. Added pec stretch which caused increase in radicular symptoms while completing which eased following. Patient tolerates additional postural strengthening and is given min cueing for posture while completing.    Examination-Activity Limitations Reach Overhead;Lift;Sit;Other    Examination-Participation Restrictions Occupation;Yard Work;Cleaning;Community Activity    Stability/Clinical Decision Making Stable/Uncomplicated    Rehab Potential Good    PT Frequency 2x / week    PT Duration 4 weeks    PT Treatment/Interventions ADLs/Self Care Home Management;Aquatic Therapy;Biofeedback;Cryotherapy;Electrical Stimulation;Moist Heat;Iontophoresis 22m/ml Dexamethasone;Therapeutic activities;Therapeutic exercise;Manual lymph drainage;Balance training;Traction;Manual techniques;Vasopneumatic Device;Taping;Splinting;Energy  conservation;Orthotic Fit/Training;Stair training;Functional mobility training;Gait training;DME Instruction;Patient/family education;Passive range of motion;Joint Manipulations;Spinal Manipulations;Scar mobilization;Compression bandaging;Neuromuscular re-education;Ultrasound;Parrafin;Fluidtherapy;Contrast Bath;Dry needling    PT Next Visit Plan Progress postural strength as tolerated.  Trial manual cervical traction if indicated.    PT Home Exercise Plan Eval: postural correction, chin tuck 12/13 supine chin tuck    Consulted and Agree with  Plan of Care Patient           Patient will benefit from skilled therapeutic intervention in order to improve the following deficits and impairments:  Hypomobility,Impaired sensation,Decreased activity tolerance,Decreased strength,Pain,Impaired UE functional use,Decreased mobility,Postural dysfunction,Improper body mechanics,Decreased range of motion  Visit Diagnosis: Cervicalgia  Radiculopathy, cervical region     Problem List Patient Active Problem List   Diagnosis Date Noted  . Chest discomfort 04/10/2017  . Hyperlipidemia LDL goal <100 04/10/2017  . Family history of early CAD 04/10/2017  . Fatigue 04/10/2017  . Accelerating angina ([place])   . Screening for colon cancer 12/31/2011  . Other specified nutritional deficiencies 10/03/2009  . MIGRAINE HEADACHE 09/30/2009  . Regional enteritis [facility]) 09/30/2009   2:46 PM, 07/11/20 Mearl Latin PT, DPT Physical Therapist at [place], [place], [address] Phone: [phone]   Fax:  [phone]  Name: Bobby Gross MRN: [id number] Date of Birth: [birth year]

## 2020-07-14 ENCOUNTER — Other Ambulatory Visit: Payer: Self-pay

## 2020-07-14 ENCOUNTER — Ambulatory Visit (HOSPITAL_COMMUNITY): Payer: 59 | Admitting: Physical Therapy

## 2020-07-14 ENCOUNTER — Encounter (HOSPITAL_COMMUNITY): Payer: Self-pay | Admitting: Physical Therapy

## 2020-07-14 DIAGNOSIS — M542 Cervicalgia: Secondary | ICD-10-CM

## 2020-07-14 DIAGNOSIS — M5412 Radiculopathy, cervical region: Secondary | ICD-10-CM

## 2020-07-14 NOTE — Patient Instructions (Signed)
Access Code: [email or web address] URL: [email or web address] Date: 07/14/2020 Prepared by: Sherlyn Lees  Exercises Supine Cervical Retraction with Towel - 1 x daily - 7 x weekly - 3 sets - 10 reps - 5 hold Supine Segmental Cervical Flexion - 1 x daily - 7 x weekly - 5 sets - 10 reps - 5 hold Cervical Extension AROM with Strap - 1 x daily - 7 x weekly - 3 sets - 10 reps

## 2020-07-14 NOTE — Therapy (Addendum)
[place], [place], [address] Phone: [phone]   Fax:  [phone]  Physical Therapy Treatment  Patient Details  Name: Bobby Gross MRN: [id number] Date of Birth: 08/13/1955 Referring Provider (PT): Kristeen Miss MD    Encounter Date: 07/14/2020   PT End of Session - 07/14/20 0745    Visit Number 3    Number of Visits 8    Date for PT Re-Evaluation 08/05/20    Authorization Type [employer] (No auth, 60 VL)    Authorization - Visit Number 3    Authorization - Number of Visits 60    PT Start Time [phone]    PT Stop Time 0825    PT Time Calculation (min) 39 min    Activity Tolerance Patient tolerated treatment well    Behavior During Therapy [facility] for tasks assessed/performed           Past Medical History:  Diagnosis Date  . B12 deficiency    Following small bowel resection  . Crohn's disease ([place])    Involving small bowel  . History of nephrolithiasis   . Hyperlipemia     Past Surgical History:  Procedure Laterality Date  . BIOPSY  01/31/2017   Procedure: BIOPSY;  Surgeon: [name], MD;  [facility];  Service: Endoscopy;;  biopsy    . COLONOSCOPY  10/03/2006   Normal rectum/Status post right hemicolectomy with a small bowel anastomosis,  fibrotic-appearing anastomosis consistent with Crohn's, status post  biopsy, but overall, things look really good today  . COLONOSCOPY  01/28/2012   Dr. Sabino Gasser R hemicolectomy for ileocolonic crohn's disease. disease appears inactive at this time.  all Bx's benign.  . COLONOSCOPY N/A 01/31/2017   Procedure: COLONOSCOPY;  Surgeon: [name], MD;  [facility];  Service: Endoscopy;  Laterality: N/A;  7:30am  . DEXA scan  09/2010  . LEFT HEART CATH AND CORONARY ANGIOGRAPHY N/A 04/10/2017   Procedure: LEFT HEART CATH AND CORONARY ANGIOGRAPHY;  Surgeon: Belva Crome, MD;  Location: [place] [facility];  Service: Cardiovascular;   Laterality: N/A;  . RIGHT FOOT SURGER  2009   PLANTAR FASCITIS  . RIGHT SHOULDER SURGERY    . RIGHT WRIST SURGERY    . SMALL INTESTINE SURGERY  1988   AT [facility]    There were no vitals filed for this visit.   Subjective Assessment - 07/14/20 0801    Subjective Neck doesn't feel too bad and reports he has been completing chin retractions on a frequent basis. Patient reports that he begins to experience numbess and tingling in 1st three digits right hand with exertion and lifting tasks    Pertinent History RT RCR x 2    Limitations House hold activities;Sitting;Reading;Lifting    Diagnostic tests MRI    Patient Stated Goals Avoid surgery    Pain Onset More than a month ago              [facility] PT Assessment - 07/14/20 0001      Assessment   Medical Diagnosis Cervical radiculopathy     Referring Provider (PT) Kristeen Miss MD       Special Tests    Special Tests Cervical    Cervical Tests --   (-) Tinel's R wrist/elbow                        [facility] Adult PT Treatment/Exercise - 07/14/20 0001  Neck Exercises: Supine   Neck Retraction 10 reps;5 secs    Capital Flexion 10 reps;5 secs    Other Supine Exercise anterior shoulder/pec stretch   towel roll between shoulder blades   Other Supine Exercise cervical extension w/ strap   mid-cervical snag 3x10 for extension ROM     Manual Therapy   Manual Therapy Manual Traction    Manual therapy comments Performed independently of all other interventions    Manual Traction manual cervical traction/distraction for 30 sec holds x 10 reps. Traction/sidebending to improve R lateral flexion/rotation.                  PT Education - 07/14/20 0830    Education Details educated on HEP additions and benefits of inversion table for self-traction/distraction    Person(s) Educated Patient    Methods Explanation    Comprehension Verbalized understanding            PT Short Term Goals - 07/04/20 1032      PT SHORT  TERM GOAL #1   Title Patient will be independent with initial HEP and self-management strategies to improve functional outcomes    Time 2    Period Weeks    Status New    Target Date 07/22/20             PT Long Term Goals - 07/04/20 1032      PT LONG TERM GOAL #1   Title Patient will improve FOTO score by 10% to indicate improvement in functional outcomes    Time 4    Period Weeks    Status New    Target Date 08/05/20      PT LONG TERM GOAL #2   Title Patient will report at least 75% overall improvement in subjective complaint to indicate improvement in ability to perform ADLs.    Time 4    Period Weeks    Status New    Target Date 08/05/20      PT LONG TERM GOAL #3   Title Patient will demo improved posture and postural awareness and be independant with final HEP for improved functional outcomes.    Time 4    Period Weeks    Status New    Target Date 08/05/20                 Plan - 07/14/20 0831    Clinical Impression Statement Patient exhibits limitations in Right sidebending > Left and exhibits right shoulder retraction and external rotation as compared to left likely related to hx of RCR x 2 in past.  No increase in symptoms noted with Tinel's along right UE.  Continue with postural re-education and increase in T-spine extension and scapular strengthening    Examination-Activity Limitations Reach Overhead;Lift;Sit;Other    Examination-Participation Restrictions Occupation;Yard Work;Cleaning;Community Activity    Stability/Clinical Decision Making Stable/Uncomplicated    Rehab Potential Good    PT Frequency 2x / week    PT Duration 4 weeks    PT Treatment/Interventions ADLs/Self Care Home Management;Aquatic Therapy;Biofeedback;Cryotherapy;Electrical Stimulation;Moist Heat;Iontophoresis 68m/ml Dexamethasone;Therapeutic activities;Therapeutic exercise;Manual lymph drainage;Balance training;Traction;Manual techniques;Vasopneumatic Device;Taping;Splinting;Energy  conservation;Orthotic Fit/Training;Stair training;Functional mobility training;Gait training;DME Instruction;Patient/family education;Passive range of motion;Joint Manipulations;Spinal Manipulations;Scar mobilization;Compression bandaging;Neuromuscular re-education;Ultrasound;Parrafin;Fluidtherapy;Contrast Bath;Dry needling    PT Next Visit Plan Progress postural strength as tolerated.  Trial manual cervical traction if indicated.    PT Home Exercise Plan Eval: postural correction, chin tuck 12/13 supine chin tuck    Consulted and Agree with Plan of Care Patient  Patient will benefit from skilled therapeutic intervention in order to improve the following deficits and impairments:  Hypomobility,Impaired sensation,Decreased activity tolerance,Decreased strength,Pain,Impaired UE functional use,Decreased mobility,Postural dysfunction,Improper body mechanics,Decreased range of motion  Visit Diagnosis: Cervicalgia  Radiculopathy, cervical region     Problem List Patient Active Problem List   Diagnosis Date Noted  . Chest discomfort 04/10/2017  . Hyperlipidemia LDL goal <100 04/10/2017  . Family history of early CAD 04/10/2017  . Fatigue 04/10/2017  . Accelerating angina ([place])   . Screening for colon cancer 12/31/2011  . Other specified nutritional deficiencies 10/03/2009  . MIGRAINE HEADACHE 09/30/2009  . Regional enteritis ([place]) 09/30/2009    8:43 AM, 07/14/20 M. Sherlyn Lees, PT, DPT Physical Therapist- [place] Office Number: [phone]  [place] [address] [place], [place], [address] Phone: [phone]   Fax:  [phone]  Name: Bobby Gross MRN: [id number] Date of Birth: January 15, 1956

## 2020-07-15 ENCOUNTER — Encounter (HOSPITAL_COMMUNITY): Payer: 59 | Admitting: Physical Therapy

## 2020-07-19 ENCOUNTER — Encounter (HOSPITAL_COMMUNITY): Payer: Self-pay | Admitting: Physical Therapy

## 2020-07-19 ENCOUNTER — Other Ambulatory Visit: Payer: Self-pay

## 2020-07-19 ENCOUNTER — Ambulatory Visit (HOSPITAL_COMMUNITY): Payer: 59 | Admitting: Physical Therapy

## 2020-07-19 DIAGNOSIS — M542 Cervicalgia: Secondary | ICD-10-CM | POA: Diagnosis not present

## 2020-07-19 DIAGNOSIS — M5412 Radiculopathy, cervical region: Secondary | ICD-10-CM

## 2020-07-19 NOTE — Therapy (Signed)
[place], [place], [address] Phone: [phone]   Fax:  [phone]  Physical Therapy Treatment  Patient Details  Name: Bobby Gross MRN: [id number] Date of Birth: 03-15-1956 Referring Provider (PT): Kristeen Miss MD    Encounter Date: 07/19/2020   PT End of Session - 07/19/20 0826    Visit Number 4    Number of Visits 8    Date for PT Re-Evaluation 08/05/20    Authorization Type [employer] (No auth, 60 VL)    Authorization - Visit Number 4    Authorization - Number of Visits 60    PT Start Time 0820    PT Stop Time 0900    PT Time Calculation (min) 40 min    Activity Tolerance Patient tolerated treatment well    Behavior During Therapy [facility] for tasks assessed/performed           Past Medical History:  Diagnosis Date  . B12 deficiency    Following small bowel resection  . Crohn's disease ([place])    Involving small bowel  . History of nephrolithiasis   . Hyperlipemia     Past Surgical History:  Procedure Laterality Date  . BIOPSY  01/31/2017   Procedure: BIOPSY;  Surgeon: [name], MD;  [facility];  Service: Endoscopy;;  biopsy    . COLONOSCOPY  10/03/2006   Normal rectum/Status post right hemicolectomy with a small bowel anastomosis,  fibrotic-appearing anastomosis consistent with Crohn's, status post  biopsy, but overall, things look really good today  . COLONOSCOPY  01/28/2012   Dr. Sabino Gasser R hemicolectomy for ileocolonic crohn's disease. disease appears inactive at this time.  all Bx's benign.  . COLONOSCOPY N/A 01/31/2017   Procedure: COLONOSCOPY;  Surgeon: [name], MD;  [facility];  Service: Endoscopy;  Laterality: N/A;  7:30am  . DEXA scan  09/2010  . LEFT HEART CATH AND CORONARY ANGIOGRAPHY N/A 04/10/2017   Procedure: LEFT HEART CATH AND CORONARY ANGIOGRAPHY;  Surgeon: Belva Crome, MD;  Location: [place] CV [facility];  Service: Cardiovascular;   Laterality: N/A;  . RIGHT FOOT SURGER  2009   PLANTAR FASCITIS  . RIGHT SHOULDER SURGERY    . RIGHT WRIST SURGERY    . SMALL INTESTINE SURGERY  1988   AT [facility]    There were no vitals filed for this visit.   Subjective Assessment - 07/19/20 0825    Subjective Patient says he felt alright after last visit. Patient says he had to move a lot of mulch over the weekend, did not have any numbness in hand which he normally would have.    Pertinent History RT RCR x 2    Limitations House hold activities;Sitting;Reading;Lifting    Diagnostic tests MRI    Patient Stated Goals Avoid surgery    Currently in Pain? No/denies    Pain Onset More than a month ago                             [facility] Adult PT Treatment/Exercise - 07/19/20 0001      Neck Exercises: Standing   Other Standing Exercises pec stretch in doorway (low hand position) 3x 30 second    Other Standing Exercises band rows GTB x 20, GTB shoulder extension x20, horizontal shoulder abduction GTB x20      Neck Exercises: Seated   Neck Retraction 15 reps  Other Seated Exercise chin tuck with extension x 15                    PT Short Term Goals - 07/04/20 1032      PT SHORT TERM GOAL #1   Title Patient will be independent with initial HEP and self-management strategies to improve functional outcomes    Time 2    Period Weeks    Status New    Target Date 07/22/20             PT Long Term Goals - 07/04/20 1032      PT LONG TERM GOAL #1   Title Patient will improve FOTO score by 10% to indicate improvement in functional outcomes    Time 4    Period Weeks    Status New    Target Date 08/05/20      PT LONG TERM GOAL #2   Title Patient will report at least 75% overall improvement in subjective complaint to indicate improvement in ability to perform ADLs.    Time 4    Period Weeks    Status New    Target Date 08/05/20      PT LONG TERM GOAL #3   Title Patient will demo improved posture  and postural awareness and be independant with final HEP for improved functional outcomes.    Time 4    Period Weeks    Status New    Target Date 08/05/20                 Plan - 07/19/20 0859    Clinical Impression Statement Patient tolerated session well today with no increased complaint of pain. Patient able to progress postural strengthening exercise with band rows and extension. Also added band horizontal abduction, patient was well challenged with this due to muscle weakness and fatigue. Patient educated on proper form and function of all added exercises. Patient cued on hand position with band rows and extensions. Patient will continue to benefit from skilled therapy services to progress postural strengthening to reduce pain and improve LOF with ADLs.    Examination-Activity Limitations Reach Overhead;Lift;Sit;Other    Examination-Participation Restrictions Occupation;Yard Work;Cleaning;Community Activity    Stability/Clinical Decision Making Stable/Uncomplicated    Rehab Potential Good    PT Frequency 2x / week    PT Duration 4 weeks    PT Treatment/Interventions ADLs/Self Care Home Management;Aquatic Therapy;Biofeedback;Cryotherapy;Electrical Stimulation;Moist Heat;Iontophoresis 107m/ml Dexamethasone;Therapeutic activities;Therapeutic exercise;Manual lymph drainage;Balance training;Traction;Manual techniques;Vasopneumatic Device;Taping;Splinting;Energy conservation;Orthotic Fit/Training;Stair training;Functional mobility training;Gait training;DME Instruction;Patient/family education;Passive range of motion;Joint Manipulations;Spinal Manipulations;Scar mobilization;Compression bandaging;Neuromuscular re-education;Ultrasound;Parrafin;Fluidtherapy;Contrast Bath;Dry needling    PT Next Visit Plan Progress postural strength as tolerated.  Manual traction as needed.    PT Home Exercise Plan Eval: postural correction, chin tuck 12/13 supine chin tuck 12/21: band rows, extension, horizontal  abduction    Consulted and Agree with Plan of Care Patient           Patient will benefit from skilled therapeutic intervention in order to improve the following deficits and impairments:  Hypomobility,Impaired sensation,Decreased activity tolerance,Decreased strength,Pain,Impaired UE functional use,Decreased mobility,Postural dysfunction,Improper body mechanics,Decreased range of motion  Visit Diagnosis: Cervicalgia  Radiculopathy, cervical region     Problem List Patient Active Problem List   Diagnosis Date Noted  . Chest discomfort 04/10/2017  . Hyperlipidemia LDL goal <100 04/10/2017  . Family history of early CAD 04/10/2017  . Fatigue 04/10/2017  . Accelerating angina ([place]   . Screening for colon cancer 12/31/2011  .  Other specified nutritional deficiencies 10/03/2009  . MIGRAINE HEADACHE 09/30/2009  . Regional enteritis [facility]) 09/30/2009    3:22 PM, 07/19/20 Josue Hector PT DPT  Physical Therapist with [facility]  [phone] [place] [address] [place], [place], [address] Phone: [phone]   Fax:  [phone]  Name: Bobby Gross MRN: [id number] Date of Birth: 1955/08/01

## 2020-07-21 ENCOUNTER — Encounter (HOSPITAL_COMMUNITY): Payer: 59 | Admitting: Physical Therapy

## 2020-07-26 ENCOUNTER — Other Ambulatory Visit: Payer: Self-pay

## 2020-07-26 ENCOUNTER — Ambulatory Visit (HOSPITAL_COMMUNITY): Payer: 59 | Admitting: Physical Therapy

## 2020-07-26 ENCOUNTER — Encounter (HOSPITAL_COMMUNITY): Payer: Self-pay | Admitting: Physical Therapy

## 2020-07-26 DIAGNOSIS — M5412 Radiculopathy, cervical region: Secondary | ICD-10-CM

## 2020-07-26 DIAGNOSIS — M542 Cervicalgia: Secondary | ICD-10-CM

## 2020-07-26 NOTE — Therapy (Signed)
[place], [place], [address] Phone: [phone]   Fax:  [phone]  Physical Therapy Treatment  Patient Details  Name: Bobby Gross MRN: [id number] Date of Birth: 04/20/1956 Referring Provider (PT): Kristeen Miss MD    Encounter Date: 07/26/2020   PT End of Session - 07/26/20 0746    Visit Number 5    Number of Visits 8    Date for PT Re-Evaluation 08/05/20    Authorization Type [employer] (No auth, 60 VL)    Authorization - Visit Number 5    Authorization - Number of Visits 60    PT Start Time 0746    PT Stop Time 0826    PT Time Calculation (min) 40 min    Activity Tolerance Patient tolerated treatment well    Behavior During Therapy [facility] for tasks assessed/performed           Past Medical History:  Diagnosis Date  . B12 deficiency    Following small bowel resection  . Crohn's disease ([place])    Involving small bowel  . History of nephrolithiasis   . Hyperlipemia     Past Surgical History:  Procedure Laterality Date  . BIOPSY  01/31/2017   Procedure: BIOPSY;  Surgeon: [name], MD;  [facility];  Service: Endoscopy;;  biopsy    . COLONOSCOPY  10/03/2006   Normal rectum/Status post right hemicolectomy with a small bowel anastomosis,  fibrotic-appearing anastomosis consistent with Crohn's, status post  biopsy, but overall, things look really good today  . COLONOSCOPY  01/28/2012   Dr. Sabino Gasser R hemicolectomy for ileocolonic crohn's disease. disease appears inactive at this time.  all Bx's benign.  . COLONOSCOPY N/A 01/31/2017   Procedure: COLONOSCOPY;  Surgeon: [name], MD;  [facility];  Service: Endoscopy;  Laterality: N/A;  7:30am  . DEXA scan  09/2010  . LEFT HEART CATH AND CORONARY ANGIOGRAPHY N/A 04/10/2017   Procedure: LEFT HEART CATH AND CORONARY ANGIOGRAPHY;  Surgeon: Belva Crome, MD;  Location: [place] [facility];  Service: Cardiovascular;   Laterality: N/A;  . RIGHT FOOT SURGER  2009   PLANTAR FASCITIS  . RIGHT SHOULDER SURGERY    . RIGHT WRIST SURGERY    . SMALL INTESTINE SURGERY  1988   AT [facility]    There were no vitals filed for this visit.   Subjective Assessment - 07/26/20 0757    Subjective Patient reports he is feeling a little better. Reports he is not having the tingling but he has not been as active as he was. Reports current pain is 0/10. States that the horizontal abd gets his shoulder pain going and sitting with chin tuck increases pain in right shoulder but neck is just stiff. chin tuck supine is ok.    Pertinent History RT RCR x 2    Limitations House hold activities;Sitting;Reading;Lifting    Diagnostic tests MRI    Patient Stated Goals Avoid surgery    Currently in Pain? No/denies    Pain Onset More than a month ago              [facility] PT Assessment - 07/26/20 0001      Assessment   Medical Diagnosis Cervical radiculopathy     Referring Provider (PT) Kristeen Miss MD                          [facility] Adult  PT Treatment/Exercise - 07/26/20 0001      Neck Exercises: Standing   Other Standing Exercises overhead pull down R for scapular rotation x10    Other Standing Exercises lat pull down GTB - 2x15 5" holds - verbal cues for form, in sequence demonstration; scapular protraction 3x10      Neck Exercises: Seated   Neck Retraction 15 reps   with cervical lengthening- verbal cues                 PT Education - 07/26/20 0813    Education Details educated patient in new exercises, scapular movement and form for exercise.    Person(s) Educated Patient    Methods Explanation    Comprehension Verbalized understanding            PT Short Term Goals - 07/04/20 1032      PT SHORT TERM GOAL #1   Title Patient will be independent with initial HEP and self-management strategies to improve functional outcomes    Time 2    Period Weeks    Status New    Target Date 07/22/20              PT Long Term Goals - 07/04/20 1032      PT LONG TERM GOAL #1   Title Patient will improve FOTO score by 10% to indicate improvement in functional outcomes    Time 4    Period Weeks    Status New    Target Date 08/05/20      PT LONG TERM GOAL #2   Title Patient will report at least 75% overall improvement in subjective complaint to indicate improvement in ability to perform ADLs.    Time 4    Period Weeks    Status New    Target Date 08/05/20      PT LONG TERM GOAL #3   Title Patient will demo improved posture and postural awareness and be independant with final HEP for improved functional outcomes.    Time 4    Period Weeks    Status New    Target Date 08/05/20                 Plan - 07/26/20 0747    Clinical Impression Statement Reviewed sitting chin tuck as this was causing increase in right shoulder symptoms. Added cue to lengthen cervical spine with this exercise and patient reported standing right shoulder pain resolved. Patient also reported discomfort with horizontal shoulder abduction exercise so this was discontinued secondary to form.  Added scapular strengthening exercises which was tolerated well. Reduced symptoms noted end of session. Will continue with scapular movements in future sessions.    Examination-Activity Limitations Reach Overhead;Lift;Sit;Other    Examination-Participation Restrictions Occupation;Yard Work;Cleaning;Community Activity    Stability/Clinical Decision Making Stable/Uncomplicated    Rehab Potential Good    PT Frequency 2x / week    PT Duration 4 weeks    PT Treatment/Interventions ADLs/Self Care Home Management;Aquatic Therapy;Biofeedback;Cryotherapy;Electrical Stimulation;Moist Heat;Iontophoresis 40m/ml Dexamethasone;Therapeutic activities;Therapeutic exercise;Manual lymph drainage;Balance training;Traction;Manual techniques;Vasopneumatic Device;Taping;Splinting;Energy conservation;Orthotic Fit/Training;Stair  training;Functional mobility training;Gait training;DME Instruction;Patient/family education;Passive range of motion;Joint Manipulations;Spinal Manipulations;Scar mobilization;Compression bandaging;Neuromuscular re-education;Ultrasound;Parrafin;Fluidtherapy;Contrast Bath;Dry needling    PT Next Visit Plan focus on scapular mobility - upward and downward rotation    PT Home Exercise Plan Eval: postural correction, chin tuck 12/13 supine chin tuck 12/21: band rows, extension, 12/28 scapular protration, lat pull down    Consulted and Agree with Plan of Care Patient  Patient will benefit from skilled therapeutic intervention in order to improve the following deficits and impairments:  Hypomobility,Impaired sensation,Decreased activity tolerance,Decreased strength,Pain,Impaired UE functional use,Decreased mobility,Postural dysfunction,Improper body mechanics,Decreased range of motion  Visit Diagnosis: Cervicalgia  Radiculopathy, cervical region     Problem List Patient Active Problem List   Diagnosis Date Noted  . Chest discomfort 04/10/2017  . Hyperlipidemia LDL goal <100 04/10/2017  . Family history of early CAD 04/10/2017  . Fatigue 04/10/2017  . Accelerating angina ([place])   . Screening for colon cancer 12/31/2011  . Other specified nutritional deficiencies 10/03/2009  . MIGRAINE HEADACHE 09/30/2009  . Regional enteritis [facility]) 09/30/2009    8:28 AM, 07/26/20 Jerene Pitch, DPT Physical Therapy with [facility]  [phone] office  [place] [address] [place], [place], [address] Phone: [phone]   Fax:  [phone]  Name: YOUSSOUF SHIPLEY MRN: [id number] Date of Birth: 03-10-1956

## 2020-07-26 NOTE — Patient Instructions (Signed)
Access Code: [email or web address] URL: [email or web address] Date: 07/26/2020 Prepared by: Yetta Glassman  Exercises Seated Lat Pull Down with Resistance - Elbows Bent - 1 x daily - 7 x weekly - 2 sets - 15 reps - 5 hold Standing Serratus Punch with Resistance - 1 x daily - 7 x weekly - 2 sets - 15 reps - 5 hold

## 2020-08-01 ENCOUNTER — Encounter (HOSPITAL_COMMUNITY): Payer: 59 | Admitting: Physical Therapy

## 2020-08-01 ENCOUNTER — Telehealth (HOSPITAL_COMMUNITY): Payer: Self-pay | Admitting: Physical Therapy

## 2020-08-01 NOTE — Telephone Encounter (Signed)
pt cancelled appt because his dog is sick

## 2020-08-03 ENCOUNTER — Other Ambulatory Visit: Payer: Self-pay

## 2020-08-03 ENCOUNTER — Ambulatory Visit (HOSPITAL_COMMUNITY): Payer: 59 | Attending: Neurological Surgery | Admitting: Physical Therapy

## 2020-08-03 ENCOUNTER — Encounter (HOSPITAL_COMMUNITY): Payer: Self-pay | Admitting: Physical Therapy

## 2020-08-03 DIAGNOSIS — M542 Cervicalgia: Secondary | ICD-10-CM | POA: Diagnosis not present

## 2020-08-03 DIAGNOSIS — M5412 Radiculopathy, cervical region: Secondary | ICD-10-CM | POA: Diagnosis present

## 2020-08-03 NOTE — Therapy (Signed)
[facility] [address] [place], [place], [address] Phone: [phone]   Fax:  [phone]  Physical Therapy Treatment  Patient Details  Name: Bobby Gross MRN: [id number] Date of Birth: Nov 12, 1955 Referring Provider (PT): Kristeen Miss MD   PHYSICAL THERAPY DISCHARGE SUMMARY  Visits from Start of Care: 6  Current functional level related to goals / functional outcomes: See below    Remaining deficits: See below     Education / Equipment: See assessment  Plan: Patient agrees to discharge.  Patient goals were partially met. Patient is being discharged due to being pleased with the current functional level.  ?????      Encounter Date: 08/03/2020   PT End of Session - 08/03/20 0823    Visit Number 6    Number of Visits 8    Date for PT Re-Evaluation 08/05/20    Authorization Type [employer] (No auth, 60 VL)    Authorization - Visit Number 6    Authorization - Number of Visits 60    PT Start Time 0818    PT Stop Time 0905    PT Time Calculation (min) 47 min    Activity Tolerance Patient tolerated treatment well    Behavior During Therapy WFL for tasks assessed/performed           Past Medical History:  Diagnosis Date  . B12 deficiency    Following small bowel resection  . Crohn's disease ([place])    Involving small bowel  . History of nephrolithiasis   . Hyperlipemia     Past Surgical History:  Procedure Laterality Date  . BIOPSY  01/31/2017   Procedure: BIOPSY;  Surgeon: [name], MD;  [facility];  Service: Endoscopy;;  biopsy    . COLONOSCOPY  10/03/2006   Normal rectum/Status post right hemicolectomy with a small bowel anastomosis,  fibrotic-appearing anastomosis consistent with Crohn's, status post  biopsy, but overall, things look really good today  . COLONOSCOPY  01/28/2012   Dr. Sabino Gasser R hemicolectomy for ileocolonic crohn's disease. disease appears inactive at this time.  all  Bx's benign.  . COLONOSCOPY N/A 01/31/2017   Procedure: COLONOSCOPY;  Surgeon: [name], MD;  [facility];  Service: Endoscopy;  Laterality: N/A;  7:30am  . DEXA scan  09/2010  . LEFT HEART CATH AND CORONARY ANGIOGRAPHY N/A 04/10/2017   Procedure: LEFT HEART CATH AND CORONARY ANGIOGRAPHY;  Surgeon: Belva Crome, MD;  Location: [facility];  Service: Cardiovascular;  Laterality: N/A;  . RIGHT FOOT SURGER  2009   PLANTAR FASCITIS  . RIGHT SHOULDER SURGERY    . RIGHT WRIST SURGERY    . SMALL INTESTINE SURGERY  1988   AT [facility]    There were no vitals filed for this visit.   Subjective Assessment - 08/03/20 0823    Subjective Patient says he feels pretty good. Feels therapy has helped. Patient says he feels about 50-60% improved since starting therapy.    Pertinent History RT RCR x 2    Limitations House hold activities;Sitting;Reading;Lifting    Diagnostic tests MRI    Patient Stated Goals Avoid surgery    Currently in Pain? No/denies    Pain Onset More than a month ago              [facility] PT Assessment - 08/03/20 0001      Assessment   Medical Diagnosis Cervical radiculopathy     Referring Provider (PT)  Kristeen Miss MD     Prior Therapy No       Precautions   Precautions None      Restrictions   Weight Bearing Restrictions No      Home Environment   Living Environment Private residence      Prior Function   Level of Independence Independent      Cognition   Overall Cognitive Status Within Functional Limits for tasks assessed      Observation/Other Assessments   Focus on Therapeutic Outcomes (FOTO)  80% function   was 60% function     AROM   Cervical Flexion WFL    Cervical Extension 45   was 38   Cervical - Right Side Bend 25   was 24   Cervical - Left Side Bend 28   was 26   Cervical - Right Rotation 68   was 58   Cervical - Left Rotation 65   was 58                        [facility] Adult PT Treatment/Exercise - 08/03/20  0001      Neck Exercises: Standing   Other Standing Exercises threading the needle x10 each, GTB D1 and D2 PNF flexion x10 each                    PT Short Term Goals - 08/03/20 8786      PT SHORT TERM GOAL #1   Title Patient will be independent with initial HEP and self-management strategies to improve functional outcomes    Baseline Reports and demos compliance    Time 2    Period Weeks    Status Achieved    Target Date 07/22/20             PT Long Term Goals - 08/03/20 0921      PT LONG TERM GOAL #1   Title Patient will improve FOTO score by 10% to indicate improvement in functional outcomes    Baseline improved by 20%    Time 4    Period Weeks    Status Achieved      PT LONG TERM GOAL #2   Title Patient will report at least 75% overall improvement in subjective complaint to indicate improvement in ability to perform ADLs.    Baseline Reports 50-60%    Time 4    Period Weeks    Status Partially Met      PT LONG TERM GOAL #3   Title Patient will demo improved posture and postural awareness and be independant with final HEP for improved functional outcomes.    Baseline Returns demonstration, shows good return, issued HEP handout and reviewed with patient    Time 4    Period Weeks    Status Achieved                 Plan - 08/03/20 0924    Clinical Impression Statement Patient shows good progress toward therapy goals. Patient with significant improvement in FOTO score as well as improved cervical AROM. Patient reports no current radicular symptoms and shows good return with established HEP. Discussed reassessment findings and progressed scapular strengthening exercises. Issued patient updated HEP handout. Patient being DC from therapy today to transition to HEP, with most goals met. Patient instructed to follow up with therapy services with any further questions or concerns.    Examination-Activity Limitations Reach Overhead;Lift;Sit;Other     Examination-Participation Restrictions Occupation;[employer]  Work;Cleaning;Community Activity    Stability/Clinical Decision Making Stable/Uncomplicated    Rehab Potential Good    PT Frequency 2x / week    PT Duration 4 weeks    PT Treatment/Interventions ADLs/Self Care Home Management;Aquatic Therapy;Biofeedback;Cryotherapy;Electrical Stimulation;Moist Heat;Iontophoresis 51m/ml Dexamethasone;Therapeutic activities;Therapeutic exercise;Manual lymph drainage;Balance training;Traction;Manual techniques;Vasopneumatic Device;Taping;Splinting;Energy conservation;Orthotic Fit/Training;Stair training;Functional mobility training;Gait training;DME Instruction;Patient/family education;Passive range of motion;Joint Manipulations;Spinal Manipulations;Scar mobilization;Compression bandaging;Neuromuscular re-education;Ultrasound;Parrafin;Fluidtherapy;Contrast Bath;Dry needling    PT Next Visit Plan DC to HEP    PT Home Exercise Plan Eval: postural correction, chin tuck 12/13 supine chin tuck 12/21: band rows, extension, 12/28 scapular protration, lat pull down 08/03/20: PNF D1 and D2 flexion and threading the needle    Consulted and Agree with Plan of Care Patient           Patient will benefit from skilled therapeutic intervention in order to improve the following deficits and impairments:  Hypomobility,Impaired sensation,Decreased activity tolerance,Decreased strength,Pain,Impaired UE functional use,Decreased mobility,Postural dysfunction,Improper body mechanics,Decreased range of motion  Visit Diagnosis: Cervicalgia  Radiculopathy, cervical region     Problem List Patient Active Problem List   Diagnosis Date Noted  . Chest discomfort 04/10/2017  . Hyperlipidemia LDL goal <100 04/10/2017  . Family history of early CAD 04/10/2017  . Fatigue 04/10/2017  . Accelerating angina ([place]   . Screening for colon cancer 12/31/2011  . Other specified nutritional deficiencies 10/03/2009  . MIGRAINE HEADACHE  09/30/2009  . Regional enteritis ([facility] 09/30/2009    9:31 AM, 08/03/20 CJosue HectorPT DPT  [facility] [phone] [address] [address]Phone: [phone]  Fax:  [phone] Name: Bobby BERRETTMRN: [id number] of Birth: 106-17-1957

## 2020-08-03 NOTE — Patient Instructions (Signed)
Access Code: [email or web address] URL: [email or web address] Date: 08/03/2020 Prepared by: Josue Hector  Exercises Plank with Thoracic Rotation on Counter - 1 x daily - 4 x weekly - 1 sets - 10 reps - 5 second hold Quadruped Full Range Thoracic Rotation with Reach - 1 x daily - 4 x weekly - 1 sets - 10 reps - 5 second hold Standing Shoulder Single Arm PNF D2 Flexion with Anchored Resistance - 1 x daily - 4 x weekly - 2 sets - 10 reps Shoulder PNF D2 with Resistance - 1 x daily - 4 x weekly - 2 sets - 10 reps Standing Single Arm Shoulder PNF D1 Flexion with Anchored Resistance - 1 x daily - 4 x weekly - 2 sets - 10 reps

## 2020-09-22 ENCOUNTER — Encounter: Payer: Self-pay | Admitting: Internal Medicine

## 2020-10-31 ENCOUNTER — Other Ambulatory Visit: Payer: Self-pay | Admitting: Gastroenterology

## 2020-11-08 ENCOUNTER — Other Ambulatory Visit: Payer: Self-pay

## 2020-11-08 ENCOUNTER — Ambulatory Visit: Payer: 59 | Admitting: Internal Medicine

## 2020-11-08 ENCOUNTER — Telehealth: Payer: Self-pay

## 2020-11-08 ENCOUNTER — Encounter: Payer: Self-pay | Admitting: Internal Medicine

## 2020-11-08 VITALS — BP 149/85 | HR 73 | Temp 97.5°F | Ht 71.0 in | Wt 176.4 lb

## 2020-11-08 DIAGNOSIS — K5010 Crohn's disease of large intestine without complications: Secondary | ICD-10-CM | POA: Diagnosis not present

## 2020-11-08 NOTE — Telephone Encounter (Signed)
Bone density study scheduled for 11/16/20 at 11:00am, arrive at 10:45am. Stop Calcium supplements for 2 days prior to test.  Tried to call pt, left detailed message on voicemail to inform him of bone density study appt. Letter mailed.  No PA needed per [facility] website.

## 2020-11-08 NOTE — Progress Notes (Unsigned)
d 

## 2020-11-08 NOTE — Progress Notes (Signed)
Primary Care Physician:  Curlene Labrum, MD Primary Gastroenterologist:  Dr. [name]  Pre-Procedure History & Physical: HPI:  Bobby Gross is a 65 y.o. male here for ileocolonic Crohn's disease.  Last colonoscopy 2018 demonstrated fibrotic stricture at the anastomosis.  He did have some active inflammation on biopsies.  Clinically, he continues to do well with off label Lialda 4.8 g daily.  1-4 semiformed bowel movements daily.  No bleeding mucus no associated abdominal pain.  Sense of wellbeing is good.  No NSAIDs. Since he had a colonoscopy in 2018 appears that we could perform a follow-up colonoscopy 1 year from now. He continues to be active in retirement.  He plays golf frequently.  Unfortunately, he just lost the family pet dog this morning due to illness.  Past Medical History:  Diagnosis Date  . B12 deficiency    Following small bowel resection  . Crohn's disease ([place])    Involving small bowel  . History of nephrolithiasis   . Hyperlipemia     Past Surgical History:  Procedure Laterality Date  . BIOPSY  01/31/2017   Procedure: BIOPSY;  Surgeon: [name], MD;  [facility];  Service: Endoscopy;;  biopsy    . COLONOSCOPY  10/03/2006   Normal rectum/Status post right hemicolectomy with a small bowel anastomosis,  fibrotic-appearing anastomosis consistent with Crohn's, status post  biopsy, but overall, things look really good today  . COLONOSCOPY  01/28/2012   Dr. Sabino Gasser R hemicolectomy for ileocolonic crohn's disease. disease appears inactive at this time.  all Bx's benign.  . COLONOSCOPY N/A 01/31/2017   Procedure: COLONOSCOPY;  Surgeon: [name], MD;  [facility];  Service: Endoscopy;  Laterality: N/A;  7:30am  . DEXA scan  09/2010  . LEFT HEART CATH AND CORONARY ANGIOGRAPHY N/A 04/10/2017   Procedure: LEFT HEART CATH AND CORONARY ANGIOGRAPHY;  Surgeon: Belva Crome, MD;  Location: [facility];  Service: Cardiovascular;   Laterality: N/A;  . RIGHT FOOT SURGER  2009   PLANTAR FASCITIS  . RIGHT SHOULDER SURGERY    . RIGHT WRIST SURGERY    . [place]    Prior to Admission medications   Medication Sig Start Date End Date Taking? Authorizing Provider  acetaminophen (TYLENOL) 500 MG tablet Take 2 tablets (1,000 mg total) by mouth every 6 (six) hours as needed. 05/08/17  Yes Charlesetta Shanks, MD  CALCIUM-VITAMIN D PO Take by mouth. Takes 3-4 times/week   Yes [provider]  cetirizine (ZYRTEC) 10 MG tablet Take 10 mg by mouth daily as needed for allergies.   Yes [provider]  cyanocobalamin (,VITAMIN B-12,) 1000 MCG/ML injection inject ONE ML into THE muscle every 30 DAYS 06/01/20  Yes Carlis Stable, NP  levothyroxine (SYNTHROID, LEVOTHROID) 50 MCG tablet Take 1 tablet by mouth daily. 09/22/18  Yes [provider]  LIALDA 1.2 g EC tablet TAKE 4 TABLETS BY MOUTH  DAILY 11/01/20  Yes Annitta Needs, NP  Multiple Vitamin (MULTIVITAMIN WITH MINERALS) TABS Take 1 tablet by mouth daily.   Yes [provider]  tamsulosin (FLOMAX) 0.4 MG CAPS capsule Take 0.4 mg by mouth daily. 10/12/19  Yes [provider]    Allergies as of 11/08/2020  . (No Known Allergies)    Family History  Problem Relation Age of Onset  . Colon cancer Other     Social History   Socioeconomic History  .  Marital status: Married    Spouse name: Not on file  . Number of children: Not on file  . Years of education: Not on file  . Highest education level: Not on file  Occupational History  . Not on file  Tobacco Use  . Smoking status: Never Smoker  . Smokeless tobacco: Never Used  . Tobacco comment: Never smoker  Substance and Sexual Activity  . Alcohol use: No  . Drug use: No  . Sexual activity: Not on file  Other Topics Concern  . Not on file  Social History Narrative  . Not on file   Social Determinants of Health   Financial Resource Strain: Not on file   Food Insecurity: Not on file  Transportation Needs: Not on file  Physical Activity: Not on file  Stress: Not on file  Social Connections: Not on file  Intimate Partner Violence: Not on file    Review of Systems: See HPI, otherwise negative ROS  Physical Exam: BP (!) 149/85   Pulse 73   Temp (!) 97.5 F (36.4 C)   Ht 5' 11"  (1.803 m)   Wt 176 lb 6.4 oz (80 kg)   BMI 24.60 kg/m  General:   Alert,   pleasant and cooperative in NAD Neck:  Supple; no masses or thyromegaly. No significant cervical adenopathy. Lungs:  Clear throughout to auscultation.   No wheezes, crackles, or rhonchi. No acute distress. Heart:  Regular rate and rhythm; no murmurs, clicks, rubs,  or gallops. Abdomen: Non-distended, normal bowel sounds.  Soft and nontender without appreciable mass or hepatosplenomegaly.  Pulses:  Normal pulses noted. Extremities:  Without clubbing or edema.  Impression/Plan: Longstanding ileocolonic Crohn's disease.  He developed a surgical remission after surgery years ago. Clinically, he is getting along well with Lialda as off label treatment for Crohn's. We discussed other possibilities including immunomodulators and biologic therapy.  Not needed at this time.  Patient not interested in escalating therapy when Lialda seems to be working.  Recommendations:  Continue Lialda 4.8 g daily to control Crohn's disease.  This medication is a very good safety profile.  As discussed, it is not official treatment for Crohn's but works.  Please bring by copies of her recent lab work as discussed  Continue with your B12 supplement monthly  Avoid nonsteroidal medications  We will schedule you for a bone density study in the near future  Colonoscopy is not needed until 2023 (we will utilize the pediatric colonoscope at that time)  Unless something comes up, we will see you back in the office in 1 year.    Notice: This dictation was prepared with Dragon dictation along with smaller  phrase technology. Any transcriptional errors that result from this process are unintentional and may not be corrected upon review.

## 2020-11-08 NOTE — Patient Instructions (Signed)
Continue Lialda 4.8 g daily to control Crohn's disease.  This medication is a very good safety profile.  As discussed, it is not official treatment for Crohn's but works.  Please bring by copies of her recent lab work as discussed  Continue with your B12 supplement monthly  Avoid nonsteroidal medications  We will schedule you for a bone density study in the near future  Colonoscopy is not needed until 2023.  Unless something comes up, we will see you back in the office in 1 year.

## 2020-11-16 ENCOUNTER — Other Ambulatory Visit: Payer: Self-pay

## 2020-11-16 ENCOUNTER — Ambulatory Visit (HOSPITAL_COMMUNITY)
Admission: RE | Admit: 2020-11-16 | Discharge: 2020-11-16 | Disposition: A | Discharge status: Home / Self Care | Payer: 59 | Source: Ambulatory Visit | Attending: Internal Medicine | Admitting: Internal Medicine

## 2020-11-16 DIAGNOSIS — K5010 Crohn's disease of large intestine without complications: Secondary | ICD-10-CM | POA: Diagnosis present

## 2021-01-17 ENCOUNTER — Telehealth: Payer: Self-pay | Admitting: Internal Medicine

## 2021-01-17 NOTE — Telephone Encounter (Signed)
Spoke to pt.  Made him aware of results and recommendations made by Dr. [name].  Pt made aware that we would like to arrange hepatic function profile to be repeated sometime next month.  Dr. [name]: Pt said that his PCP (Dr. Pleas Koch) has arranged lab work for him to have done 02/16/2021 and he has a follow up ov with Dr. Pleas Koch on 03/02/2021.

## 2021-01-17 NOTE — Telephone Encounter (Signed)
Received copy of labs provided by patient from [place] dated 08/19/2020.  Labs did look good CBC entirely normal.  LFTs look good except for some slight bump in alkaline phosphatase 144.  This is nonspecific.  Previously normal.  I recommend a hepatic function profile be repeated sometime in the next month. Please let him know.

## 2021-03-03 ENCOUNTER — Telehealth: Payer: Self-pay | Admitting: Internal Medicine

## 2021-03-03 NOTE — Telephone Encounter (Signed)
Outside labs brought in for review.  Patient's alkaline phos phosphatase was up at 144 back on January 20  this year; elevated at 137 on 7/21 2022.  Normal and I record back 4 years ago.  Other parameters including aminotransferases total bilirubin was within normal limits repeatedly this year.   This is a nonspecific finding however, in the setting of inflammatory bowel disease, will order a right upper quadrant ultrasound given elevated alkaline phosphatase and obtain a serum AMA.  Further recommendations to follow.  Please get back with the patient on this.  Thanks.

## 2021-03-13 ENCOUNTER — Other Ambulatory Visit: Payer: Self-pay | Admitting: Internal Medicine

## 2021-03-13 ENCOUNTER — Telehealth: Payer: Self-pay | Admitting: Internal Medicine

## 2021-03-13 DIAGNOSIS — R748 Abnormal levels of other serum enzymes: Secondary | ICD-10-CM

## 2021-03-13 NOTE — Telephone Encounter (Signed)
Called and spoke with pt informing him of labwork to be done in December. Mailed pt lab orders with sticky note reminding him to have done in December. Pt verbalized understanding.

## 2021-03-13 NOTE — Telephone Encounter (Signed)
Labs sent to [place] from his PCP indicate alkaline phosphatase mildly elevated in July.  Previously normal over the years.  Nonspecific.  Lets just go ahead and repeat a hepatic function profile in December 2022.  Thanks.

## 2021-03-20 NOTE — Progress Notes (Signed)
Pt not to have done until Dec.

## 2021-03-31 ENCOUNTER — Other Ambulatory Visit: Payer: Self-pay | Admitting: Nurse Practitioner

## 2021-06-06 ENCOUNTER — Telehealth: Payer: Self-pay | Admitting: Internal Medicine

## 2021-06-06 NOTE — Telephone Encounter (Signed)
Somehow this was on my recall for labs.  Supposed to have a hepatic function panel

## 2021-06-06 NOTE — Telephone Encounter (Signed)
Will contact pt in Dec to remind him to have lab done.

## 2021-06-06 NOTE — Telephone Encounter (Signed)
error 

## 2021-07-18 ENCOUNTER — Other Ambulatory Visit (HOSPITAL_COMMUNITY)
Admission: RE | Admit: 2021-07-18 | Discharge: 2021-07-18 | Disposition: A | Discharge status: Home / Self Care | Payer: 59 | Source: Ambulatory Visit | Attending: Internal Medicine | Admitting: Internal Medicine

## 2021-07-18 ENCOUNTER — Other Ambulatory Visit: Payer: Self-pay

## 2021-07-18 DIAGNOSIS — R748 Abnormal levels of other serum enzymes: Secondary | ICD-10-CM | POA: Insufficient documentation

## 2021-07-18 LAB — HEPATIC FUNCTION PANEL
ALT: 23 U/L (ref 0–44)
AST: 23 U/L (ref 15–41)
Albumin: 4 g/dL (ref 3.5–5.0)
Alkaline Phosphatase: 90 U/L (ref 38–126)
Bilirubin, Direct: 0.1 mg/dL (ref 0.0–0.2)
Indirect Bilirubin: 0.5 mg/dL (ref 0.3–0.9)
Total Bilirubin: 0.6 mg/dL (ref 0.3–1.2)
Total Protein: 7 g/dL (ref 6.5–8.1)

## 2021-07-19 ENCOUNTER — Other Ambulatory Visit: Payer: Self-pay

## 2021-07-19 DIAGNOSIS — K5010 Crohn's disease of large intestine without complications: Secondary | ICD-10-CM

## 2021-07-19 DIAGNOSIS — R748 Abnormal levels of other serum enzymes: Secondary | ICD-10-CM

## 2021-09-14 ENCOUNTER — Other Ambulatory Visit: Payer: Self-pay | Admitting: Gastroenterology

## 2021-11-07 HISTORY — PX: CERVICAL SPINE SURGERY: SHX589

## 2021-11-13 ENCOUNTER — Encounter: Payer: Self-pay | Admitting: Internal Medicine

## 2021-11-28 ENCOUNTER — Encounter: Payer: Self-pay | Admitting: Internal Medicine

## 2021-11-28 ENCOUNTER — Telehealth: Payer: Self-pay

## 2021-11-28 ENCOUNTER — Other Ambulatory Visit: Payer: Self-pay

## 2021-11-28 ENCOUNTER — Ambulatory Visit: Payer: 59 | Admitting: Internal Medicine

## 2021-11-28 VITALS — BP 122/80 | HR 83 | Temp 97.8°F | Ht 72.0 in | Wt 178.8 lb

## 2021-11-28 DIAGNOSIS — K5010 Crohn's disease of large intestine without complications: Secondary | ICD-10-CM

## 2021-11-28 DIAGNOSIS — R748 Abnormal levels of other serum enzymes: Secondary | ICD-10-CM

## 2021-11-28 MED ORDER — NA SULFATE-K SULFATE-MG SULF 17.5-3.13-1.6 GM/177ML PO SOLN: 1.0000 | ORAL | 0 refills | Status: DC

## 2021-11-28 NOTE — Patient Instructions (Signed)
It was good to see you again today!  ? ?As discussed, I will treat of labs from Dr. Lizbeth Bark to make sure everything is in order ? ?We will schedule a surveillance colonoscopy (history of follow-up ileocolonic Crohn's disease) -  ?

## 2021-11-28 NOTE — H&P (View-Only) (Signed)
Primary Care Physician:  Bobby Labrum, MD Primary Gastroenterologist:  Dr. Gala Gross  Pre-Procedure History & Physical: HPI:  Bobby Gross is a 66 y.o. male here for follow-up.  Lengthy history of ileocolonic Crohn's disease status post segmental surgical resection many years ago has remained on remission historically with not off label Pentasa.  Insurance switched him to [place] recently.  He continues on Lialda 4.8 g daily, again, as off label therapy.  Has 1-4 semiformed stools daily at baseline.  Had cervical spine surgery 3 weeks ago.  He is recovering uneventfully.  He is not passing any blood no abdominal pain sense of wellbeing is good.  Previously had a slight bump in his alkaline phosphatase at 137 subsequent labs revealed a completely normal hepatic profile and renal function) last year) he had labs done at [facility] Dr. Burdine's office in February of this year.  I need to retrieve a copy for review.  He is due for surveillance colonoscopy at this time. He is on B12 supplementation. Bone density study within normal limits 2022.  Past Medical History:  Diagnosis Date   B12 deficiency    Following small bowel resection   Crohn's disease ([place])    Involving small bowel   History of nephrolithiasis    Hyperlipemia     Past Surgical History:  Procedure Laterality Date   BIOPSY  01/31/2017   Procedure: BIOPSY;  Surgeon: [name], MD;  [facility];  Service: Endoscopy;;  biopsy     COLONOSCOPY  10/03/2006   Normal rectum/Status post right hemicolectomy with a small bowel anastomosis,  fibrotic-appearing anastomosis consistent with Crohn's, status post  biopsy, but overall, things look really good today   COLONOSCOPY  01/28/2012   Dr. Sabino Gasser R hemicolectomy for ileocolonic crohn's disease. disease appears inactive at this time.  all Bx's benign.   COLONOSCOPY N/A 01/31/2017   Procedure: COLONOSCOPY;  Surgeon: [name], MD;  [facility];  Service:  Endoscopy;  Laterality: N/A;  7:30am   DEXA scan  09/2010   LEFT HEART CATH AND CORONARY ANGIOGRAPHY N/A 04/10/2017   Procedure: LEFT HEART CATH AND CORONARY ANGIOGRAPHY;  Surgeon: Belva Crome, MD;  Location: [place] [facility];  Service: Cardiovascular;  Laterality: N/A;   RIGHT FOOT SURGER  2009   PLANTAR FASCITIS   RIGHT SHOULDER SURGERY     RIGHT WRIST SURGERY     SMALL INTESTINE SURGERY  1988   AT [facility]    Prior to Admission medications   Medication Sig Start Date End Date Taking? Authorizing Provider  CALCIUM-VITAMIN D PO Take by mouth. Takes 3-4 times/week   Yes [provider]  cetirizine (ZYRTEC) 10 MG tablet Take 10 mg by mouth daily as needed for allergies.   Yes [provider]  cyanocobalamin (,VITAMIN B-12,) 1000 MCG/ML injection inject ONE ML into THE muscle every 30 DAYS 03/31/21  Yes Mahala Menghini, PA-C  doxazosin (CARDURA) 2 MG tablet Take 2 mg by mouth daily. 10/03/21  Yes [provider]  levothyroxine (SYNTHROID) 88 MCG tablet Take 1 tablet by mouth daily. 09/22/18  Yes [provider]  LIALDA 1.2 g EC tablet TAKE 4 TABLETS BY MOUTH  DAILY 09/14/21  Yes Mahala Menghini, PA-C  Multiple Vitamin (MULTIVITAMIN WITH MINERALS) TABS Take 1 tablet by mouth daily.   Yes [provider]    Allergies as of 11/28/2021   (No Known Allergies)    Family History  Problem  Relation Age of Onset   Colon cancer Other     Social History   Socioeconomic History   Marital status: Married    Spouse name: Not on file   Number of children: Not on file   Years of education: Not on file   Highest education level: Not on file  Occupational History   Not on file  Tobacco Use   Smoking status: Never   Smokeless tobacco: Never   Tobacco comments:    Never smoker  Substance and Sexual Activity   Alcohol use: No   Drug use: No   Sexual activity: Yes  Other Topics Concern   Not on file  Social History Narrative   Not on file   Social  Determinants of Health   Financial Resource Strain: Not on file  Food Insecurity: Not on file  Transportation Needs: Not on file  Physical Activity: Not on file  Stress: Not on file  Social Connections: Not on file  Intimate Partner Violence: Not on file    Review of Systems: See HPI, otherwise negative ROS  Physical Exam: BP 122/80 (BP Location: Right Arm, Patient Position: Sitting, Cuff Size: Normal)   Pulse 83   Temp 97.8 F (36.6 C) (Temporal)   Ht 6' (1.829 m)   Wt 178 lb 12.8 oz (81.1 kg)   SpO2 98%   BMI 24.25 kg/m  General:   Alert,  Well-developed, well-nourished, pleasant and cooperative in NAD Anterior surgical site healing nicely. Mouth:  No deformity or lesions. Lungs:  Clear throughout to auscultation.   No wheezes, crackles, or rhonchi. No acute distress. Heart:  Regular rate and rhythm; no murmurs, clicks, rubs,  or gallops. Abdomen: Non-distended, normal bowel sounds.  Soft and nontender without appreciable mass or hepatosplenomegaly.  Pulses:  Normal pulses noted. Extremities:  Without clubbing or edema.  Impression/Plan: 66 year old gentleman longstanding history of ileocolonic colonic Crohn's disease status post resection many years ago has clinically done well in remission on off label mesalamine therapy.  Recent cervical spine surgery as outlined above.  Clinically, doing well from a GI standpoint need to see updated labs.  He is certainly on off label treatment at this time for ileocolonic Crohn's disease.  I think it would be worthwhile to have future discussions with him regarding potentially modifying his regimen.  But, again, he appears to be doing well with his current regimen.  Discussed with the patient be prudent to check his colon and neoterminal ileum at this time.  Recommendations:  I have offered the patient a surveillance colonoscopy at this time.  The risks, benefits, limitations, alternatives and imponderables have been reviewed with the  patient. Questions have been answered. All parties are agreeable.    As discussed, I will treat of labs from Dr. Lizbeth Bark to make sure everything is in order  We will schedule a surveillance colonoscopy (history of follow-up ileocolonic Crohn's disease) -      Notice: This dictation was prepared with Dragon dictation along with smaller phrase technology. Any transcriptional errors that result from this process are unintentional and may not be corrected upon review.

## 2021-11-28 NOTE — Telephone Encounter (Signed)
PA for TCS submitted via [facility] website. PA# [id number], valid 12/20/21-03/20/22. ?

## 2021-11-28 NOTE — Progress Notes (Signed)
? ? ?Primary Care Physician:  Curlene Labrum, MD ?Primary Gastroenterologist:  Dr. [name] ? ?Pre-Procedure History & Physical: ?HPI:  Bobby Gross is a 66 y.o. male here for follow-up.  Lengthy history of ileocolonic Crohn's disease status post segmental surgical resection many years ago has remained on remission historically with not off label Pentasa.  Insurance switched him to [place] recently.  He continues on Lialda 4.8 g daily, again, as off label therapy.  Has 1-4 semiformed stools daily at baseline.  Had cervical spine surgery 3 weeks ago.  He is recovering uneventfully.  He is not passing any blood no abdominal pain sense of wellbeing is good.  Previously had a slight bump in his alkaline phosphatase at 137 subsequent labs revealed a completely normal hepatic profile and renal function) last year) he had labs done at [facility] Dr. Burdine's office in February of this year.  I need to retrieve a copy for review.  He is due for surveillance colonoscopy at this time. ?He is on B12 supplementation. ?Bone density study within normal limits 2022. ? ?Past Medical History:  ?Diagnosis Date  ? B12 deficiency   ? Following small bowel resection  ? Crohn's disease ([place])   ? Involving small bowel  ? History of nephrolithiasis   ? Hyperlipemia   ? ? ?Past Surgical History:  ?Procedure Laterality Date  ? BIOPSY  01/31/2017  ? Procedure: BIOPSY;  Surgeon: [name], MD;  [facility];  Service: Endoscopy;;  biopsy ? ?  ? COLONOSCOPY  10/03/2006  ? Normal rectum/Status post right hemicolectomy with a small bowel anastomosis,  fibrotic-appearing anastomosis consistent with Crohn's, status post  biopsy, but overall, things look really good today  ? COLONOSCOPY  01/28/2012  ? Dr. Sabino Gasser R hemicolectomy for ileocolonic crohn's disease. disease appears inactive at this time.  all Bx's benign.  ? COLONOSCOPY N/A 01/31/2017  ? Procedure: COLONOSCOPY;  Surgeon: [name], MD;  [facility];  Service:  Endoscopy;  Laterality: N/A;  7:30am  ? DEXA scan  09/2010  ? LEFT HEART CATH AND CORONARY ANGIOGRAPHY N/A 04/10/2017  ? Procedure: LEFT HEART CATH AND CORONARY ANGIOGRAPHY;  Surgeon: Belva Crome, MD;  Location: [place] [facility];  Service: Cardiovascular;  Laterality: N/A;  ? RIGHT FOOT SURGER  2009  ? PLANTAR FASCITIS  ? RIGHT SHOULDER SURGERY    ? RIGHT WRIST SURGERY    ? SMALL INTESTINE SURGERY  1988  ? AT [facility]  ? ? ?Prior to Admission medications   ?Medication Sig Start Date End Date Taking? Authorizing Provider  ?CALCIUM-VITAMIN D PO Take by mouth. Takes 3-4 times/week   Yes [provider]  ?cetirizine (ZYRTEC) 10 MG tablet Take 10 mg by mouth daily as needed for allergies.   Yes [provider]  ?cyanocobalamin (,VITAMIN B-12,) 1000 MCG/ML injection inject ONE ML into THE muscle every 30 DAYS 03/31/21  Yes Mahala Menghini, PA-C  ?doxazosin (CARDURA) 2 MG tablet Take 2 mg by mouth daily. 10/03/21  Yes [provider]  ?levothyroxine (SYNTHROID) 88 MCG tablet Take 1 tablet by mouth daily. 09/22/18  Yes [provider]  ?LIALDA 1.2 g EC tablet TAKE 4 TABLETS BY MOUTH  DAILY 09/14/21  Yes Mahala Menghini, PA-C  ?Multiple Vitamin (MULTIVITAMIN WITH MINERALS) TABS Take 1 tablet by mouth daily.   Yes [provider]  ? ? ?Allergies as of 11/28/2021  ? (No Known Allergies)  ? ? ?Family History  ?Problem  Relation Age of Onset  ? Colon cancer Other   ? ? ?Social History  ? ?Socioeconomic History  ? Marital status: Married  ?  Spouse name: Not on file  ? Number of children: Not on file  ? Years of education: Not on file  ? Highest education level: Not on file  ?Occupational History  ? Not on file  ?Tobacco Use  ? Smoking status: Never  ? Smokeless tobacco: Never  ? Tobacco comments:  ?  Never smoker  ?Substance and Sexual Activity  ? Alcohol use: No  ? Drug use: No  ? Sexual activity: Yes  ?Other Topics Concern  ? Not on file  ?Social History Narrative  ? Not on file  ? ?Social  Determinants of Health  ? ?Financial Resource Strain: Not on file  ?Food Insecurity: Not on file  ?Transportation Needs: Not on file  ?Physical Activity: Not on file  ?Stress: Not on file  ?Social Connections: Not on file  ?Intimate Partner Violence: Not on file  ? ? ?Review of Systems: ?See HPI, otherwise negative ROS ? ?Physical Exam: ?BP 122/80 (BP Location: Right Arm, Patient Position: Sitting, Cuff Size: Normal)   Pulse 83   Temp 97.8 ?F (36.6 ?C) (Temporal)   Ht 6' (1.829 m)   Wt 178 lb 12.8 oz (81.1 kg)   SpO2 98%   BMI 24.25 kg/m?  ?General:   Alert,  Well-developed, well-nourished, pleasant and cooperative in NAD ?Anterior surgical site healing nicely. ?Mouth:  No deformity or lesions. ?Lungs:  Clear throughout to auscultation.   No wheezes, crackles, or rhonchi. No acute distress. ?Heart:  Regular rate and rhythm; no murmurs, clicks, rubs,  or gallops. ?Abdomen: Non-distended, normal bowel sounds.  Soft and nontender without appreciable mass or hepatosplenomegaly.  ?Pulses:  Normal pulses noted. ?Extremities:  Without clubbing or edema. ? ?Impression/Plan: 66 year old gentleman longstanding history of ileocolonic colonic Crohn's disease status post resection many years ago has clinically done well in remission on off label mesalamine therapy.  Recent cervical spine surgery as outlined above. ? ?Clinically, doing well from a GI standpoint need to see updated labs.  He is certainly on off label treatment at this time for ileocolonic Crohn's disease.  I think it would be worthwhile to have future discussions with him regarding potentially modifying his regimen.  But, again, he appears to be doing well with his current regimen. ? ?Discussed with the patient be prudent to check his colon and neoterminal ileum at this time. ? ?Recommendations: ? ?I have offered the patient a surveillance colonoscopy at this time.  The risks, benefits, limitations, alternatives and imponderables have been reviewed with the  patient. Questions have been answered. All parties are agreeable.   ? ?As discussed, I will treat of labs from Dr. Lizbeth Bark to make sure everything is in order ? ?We will schedule a surveillance colonoscopy (history of follow-up ileocolonic Crohn's disease) -  ? ? ? ? ?Notice: This dictation was prepared with Dragon dictation along with smaller phrase technology. Any transcriptional errors that result from this process are unintentional and may not be corrected upon review.  ?

## 2021-11-30 ENCOUNTER — Other Ambulatory Visit: Payer: Self-pay

## 2021-11-30 DIAGNOSIS — R748 Abnormal levels of other serum enzymes: Secondary | ICD-10-CM

## 2021-11-30 DIAGNOSIS — K5010 Crohn's disease of large intestine without complications: Secondary | ICD-10-CM

## 2021-12-01 ENCOUNTER — Telehealth: Payer: Self-pay | Admitting: Internal Medicine

## 2021-12-01 ENCOUNTER — Other Ambulatory Visit: Payer: Self-pay | Admitting: Internal Medicine

## 2021-12-01 NOTE — Telephone Encounter (Signed)
Labs from Dr. Lizbeth Bark office dated 08/31/2021 ? ?CBC entirely normal.  TSH is 2.150 vitamin D, 25-hydroxy 29.0-one-point below normal limits LFTs all normal except alkaline phosphatase 128.  Hepatic function profile was previously normal on multiple occasions we are repeating those labs ?

## 2021-12-01 NOTE — Progress Notes (Signed)
Labs from Dr. Lizbeth Bark office 08/30/2021.  CBC looked good.  LFTs all normal except for a mildly elevated alkaline phosphatase of 128.  Liver profile has been normal repeatedly in the past.  Vitamin D level slightly low at 29.0.  Updated labs are pending.  Further recommendations to follow. ?

## 2021-12-15 ENCOUNTER — Telehealth: Payer: Self-pay | Admitting: *Deleted

## 2021-12-15 ENCOUNTER — Encounter: Payer: Self-pay | Admitting: *Deleted

## 2021-12-15 NOTE — Telephone Encounter (Signed)
Patient called in. He did not receive his prep instructions. I have left copy for pick up at front desk

## 2021-12-20 ENCOUNTER — Ambulatory Visit (HOSPITAL_COMMUNITY)
Admission: RE | Admit: 2021-12-20 | Discharge: 2021-12-20 | Disposition: A | Discharge status: Home / Self Care | Payer: 59 | Attending: Internal Medicine | Admitting: Internal Medicine

## 2021-12-20 ENCOUNTER — Other Ambulatory Visit: Payer: Self-pay

## 2021-12-20 ENCOUNTER — Ambulatory Visit (HOSPITAL_BASED_OUTPATIENT_CLINIC_OR_DEPARTMENT_OTHER): Payer: 59 | Admitting: Anesthesiology

## 2021-12-20 ENCOUNTER — Encounter (HOSPITAL_COMMUNITY)
Admission: RE | Disposition: A | Discharge status: Home / Self Care | Payer: Self-pay | Source: Home / Self Care | Attending: Internal Medicine

## 2021-12-20 ENCOUNTER — Encounter (HOSPITAL_COMMUNITY): Payer: Self-pay | Admitting: Internal Medicine

## 2021-12-20 ENCOUNTER — Ambulatory Visit (HOSPITAL_COMMUNITY): Payer: 59 | Admitting: Anesthesiology

## 2021-12-20 DIAGNOSIS — Z8249 Family history of ischemic heart disease and other diseases of the circulatory system: Secondary | ICD-10-CM

## 2021-12-20 DIAGNOSIS — Z1211 Encounter for screening for malignant neoplasm of colon: Secondary | ICD-10-CM | POA: Diagnosis present

## 2021-12-20 DIAGNOSIS — E638 Other specified nutritional deficiencies: Secondary | ICD-10-CM

## 2021-12-20 DIAGNOSIS — K9189 Other postprocedural complications and disorders of digestive system: Secondary | ICD-10-CM | POA: Diagnosis not present

## 2021-12-20 DIAGNOSIS — K5090 Crohn's disease, unspecified, without complications: Secondary | ICD-10-CM

## 2021-12-20 DIAGNOSIS — R0789 Other chest pain: Secondary | ICD-10-CM

## 2021-12-20 DIAGNOSIS — E785 Hyperlipidemia, unspecified: Secondary | ICD-10-CM

## 2021-12-20 DIAGNOSIS — I200 Unstable angina: Secondary | ICD-10-CM

## 2021-12-20 DIAGNOSIS — R5383 Other fatigue: Secondary | ICD-10-CM

## 2021-12-20 DIAGNOSIS — Z9049 Acquired absence of other specified parts of digestive tract: Secondary | ICD-10-CM | POA: Insufficient documentation

## 2021-12-20 DIAGNOSIS — Z980 Intestinal bypass and anastomosis status: Secondary | ICD-10-CM | POA: Diagnosis not present

## 2021-12-20 HISTORY — PX: COLONOSCOPY WITH PROPOFOL: SHX5780

## 2021-12-20 HISTORY — PX: BIOPSY: SHX5522

## 2021-12-20 SURGERY — COLONOSCOPY WITH PROPOFOL
Anesthesia: General

## 2021-12-20 MED ORDER — LACTATED RINGERS IV SOLN
INTRAVENOUS | Status: DC
  Administered 2021-12-20: 1000 mL via INTRAVENOUS

## 2021-12-20 MED ORDER — PROPOFOL 500 MG/50ML IV EMUL
INTRAVENOUS | Status: DC | PRN
  Administered 2021-12-20: 150 ug/kg/min via INTRAVENOUS

## 2021-12-20 MED ORDER — PROPOFOL 10 MG/ML IV BOLUS
INTRAVENOUS | Status: DC | PRN
  Administered 2021-12-20: 100 mg via INTRAVENOUS

## 2021-12-20 MED ORDER — LIDOCAINE HCL (CARDIAC) PF 100 MG/5ML IV SOSY
PREFILLED_SYRINGE | INTRAVENOUS | Status: DC | PRN
  Administered 2021-12-20: 50 mg via INTRAVENOUS

## 2021-12-20 NOTE — Anesthesia Procedure Notes (Signed)
Date/Time: 12/20/2021 11:47 AM Performed by: Orlie Dakin, CRNA Pre-anesthesia Checklist: Patient identified, Emergency Drugs available, Suction available and Patient being monitored Patient Re-evaluated:Patient Re-evaluated prior to induction Oxygen Delivery Method: Nasal cannula Induction Type: IV induction Placement Confirmation: positive ETCO2

## 2021-12-20 NOTE — Op Note (Signed)
[facility] Patient Name: Bobby Gross Procedure Date: 12/20/2021 11:15 AM MRN: [id number] Date of Birth: 11/12/1955 Attending MD: Norvel Richards , MD CSN: [id number] Age: 66 Admit Type: Outpatient Procedure:                Colonoscopy Indications:              Screening for colorectal malignant neoplasm; Providers:                Norvel Richards, MD, Lurline Del, RN, Casimer Bilis, Technician Referring MD:              Medicines:                Propofol per Anesthesia Complications:            No immediate complications. Estimated Blood Loss:     Estimated blood loss was minimal. Procedure:                Pre-Anesthesia Assessment:                           - Prior to the procedure, a History and Physical                            was performed, and patient medications and                            allergies were reviewed. The patient's tolerance of                            previous anesthesia was also reviewed. The risks                            and benefits of the procedure and the sedation                            options and risks were discussed with the patient.                            All questions were answered, and informed consent                            was obtained. Prior Anticoagulants: The patient has                            taken no previous anticoagulant or antiplatelet                            agents. ASA Grade Assessment: II - A patient with                            mild systemic disease. After reviewing the risks  and benefits, the patient was deemed in                            satisfactory condition to undergo the procedure.                           After obtaining informed consent, the colonoscope                            was passed under direct vision. Throughout the                            procedure, the patient's blood pressure, pulse, and                             oxygen saturations were monitored continuously. The                            [id number]) scope was introduced through the                            anus and advanced to the the cecum, identified by                            appendiceal orifice and ileocecal valve. The                            colonoscopy was performed without difficulty. The                            patient tolerated the procedure well. The quality                            of the bowel preparation was adequate. Scope In: 11:45:53 AM Scope Out: 11:57:49 AM Scope Withdrawal Time: 0 hours 7 minutes 53 seconds  Total Procedure Duration: 0 hours 11 minutes 56 seconds  Findings:      The perianal and digital rectal examinations were normal.      Surgical anastomosis with right colon identified. 7 mm aperture. Would       not admit the adult colonoscope. However, limited views of the upstream       small bowel revealed normal-appearing mucosa. Short fibrotic stenosis. 2       biopsies taken. Minimal bleeding. The remainder of the residual colonic       mucosa appeared normal. Impression:               -Status post right hemicolectomy. Stenotic                            anastomosis is stable in appearance. Otherwise,                            suspect relatively inactive Crohn's disease. Status  post biopsy. Moderate Sedation:      Moderate (conscious) sedation was personally administered by an       anesthesia professional. The following parameters were monitored: oxygen       saturation, heart rate, blood pressure, respiratory rate, EKG, adequacy       of pulmonary ventilation, and response to care. Recommendation:           - Patient has a contact number available for                            emergencies. The signs and symptoms of potential                            delayed complications were discussed with the                            patient. Return to normal  activities tomorrow.                            Written discharge instructions were provided to the                            patient.                           -Patient historically on Pentasa; insurance company                            switched to [place]. In this scenario, I doubt                            Bobby Gross is doing much at all for his inflammatory                            bowel disease. Will review path. May consider                            de-escalating therapy for IBD in the near future. I                            recommended patient go on a fat soluble vitamin                            supplement (DE [place])/calcium supplement. Repeat                            hepatic function profile next month. Office visit                            in 3 months. Follow-up on pathology. Procedure Code(s):        --- Professional ---                           [phone], Colonoscopy, flexible; diagnostic, including  collection of specimen(s) by brushing or washing,                            when performed (separate procedure) Diagnosis Code(s):        --- Professional ---                           Z12.11, Encounter for screening for malignant                            neoplasm of colon CPT copyright 2019 American Medical Association. All rights reserved. The codes documented in this report are preliminary and upon coder review may  be revised to meet current compliance requirements. Bobby Gross. , MD Norvel Richards, MD 12/20/2021 12:11:32 PM This report has been signed electronically. Number of Addenda: 0

## 2021-12-20 NOTE — Discharge Instructions (Addendum)
  Colonoscopy Discharge Instructions  Read the instructions outlined below and refer to this sheet in the next few weeks. These discharge instructions provide you with general information on caring for yourself after you leave the hospital. Your doctor may also give you specific instructions. While your treatment has been planned according to the most current medical practices available, unavoidable complications occasionally occur. If you have any problems or questions after discharge, call Dr. [name] at [phone]. ACTIVITY You may resume your regular activity, but move at a slower pace for the next 24 hours.  Take frequent rest periods for the next 24 hours.  Walking will help get rid of the air and reduce the bloated feeling in your belly (abdomen).  No driving for 24 hours (because of the medicine (anesthesia) used during the test).   Do not sign any important legal documents or operate any machinery for 24 hours (because of the anesthesia used during the test).  NUTRITION Drink plenty of fluids.  You may resume your normal diet as instructed by your doctor.  Begin with a light meal and progress to your normal diet. Heavy or fried foods are harder to digest and may make you feel sick to your stomach (nauseated).  Avoid alcoholic beverages for 24 hours or as instructed.  MEDICATIONS You may resume your normal medications unless your doctor tells you otherwise.  WHAT YOU CAN EXPECT TODAY Some feelings of bloating in the abdomen.  Passage of more gas than usual.  Spotting of blood in your stool or on the toilet paper.  IF YOU HAD POLYPS REMOVED DURING THE COLONOSCOPY: No aspirin products for 7 days or as instructed.  No alcohol for 7 days or as instructed.  Eat a soft diet for the next 24 hours.  FINDING OUT THE RESULTS OF YOUR TEST Not all test results are available during your visit. If your test results are not back during the visit, make an appointment with your caregiver to find out the  results. Do not assume everything is normal if you have not heard from your caregiver or the medical facility. It is important for you to follow up on all of your test results.  SEEK IMMEDIATE MEDICAL ATTENTION IF: You have more than a spotting of blood in your stool.  Your belly is swollen (abdominal distention).  You are nauseated or vomiting.  You have a temperature over 101.  You have abdominal pain or discomfort that is severe or gets worse throughout the day.     Your Crohn's disease appears inactive.  I did take some biopsies at the surgical site.  Further recommendations to follow pending review of pathology report  Office visit with [place] in 6 months   ****OFFICE WILL CALL WITH FOLLOW UP OR CHECK [place]   Please keep your appointment for lab work next month  At patient request, called Vaughan Basta at [phone] rolled to voicemail.  Left a message.

## 2021-12-20 NOTE — Interval H&P Note (Signed)
History and Physical Interval Note:  12/20/2021 11:22 AM  Bobby Gross  has presented today for surgery, with the diagnosis of Crohn's disease.  The various methods of treatment have been discussed with the patient and family. After consideration of risks, benefits and other options for treatment, the patient has consented to  Procedure(s) with comments: COLONOSCOPY WITH PROPOFOL (N/A) - 11:15am as a surgical intervention.  The patient's history has been reviewed, patient examined, no change in status, stable for surgery.  I have reviewed the patient's chart and labs.  Questions were answered to the patient's satisfaction.         no change.  Reviewed mildly elevated alkaline phosphatase and borderline low vitamin D level with patient.   Screening colonoscopy today per plan. The risks, benefits, limitations, alternatives and imponderables have been reviewed with the patient. Questions have been answered. All parties are agreeable.

## 2021-12-20 NOTE — Anesthesia Preprocedure Evaluation (Addendum)
Anesthesia Evaluation  Patient identified by MRN, date of birth, ID band Patient awake    Reviewed: Allergy & Precautions, NPO status , Patient's Chart, lab work & pertinent test results  Airway Mallampati: II  TM Distance: >3 FB Neck ROM: Limited   Comment: Cervical spine sx - 11/07/21 Dental  (+) Dental Advisory Given, Teeth Intact   Pulmonary neg pulmonary ROS,    Pulmonary exam normal breath sounds clear to auscultation       Cardiovascular Exercise Tolerance: Good + angina Normal cardiovascular exam Rhythm:Regular Rate:Normal     Neuro/Psych  Headaches, negative psych ROS   GI/Hepatic Neg liver ROS, Crohn's disease   Endo/Other  Hypothyroidism   Renal/GU negative Renal ROS  negative genitourinary   Musculoskeletal   Abdominal   Peds negative pediatric ROS (+)  Hematology negative hematology ROS (+)   Anesthesia Other Findings Cervical spine sx - 11/07/21  Reproductive/Obstetrics negative OB ROS                            Anesthesia Physical Anesthesia Plan  ASA: 2  Anesthesia Plan: General   Post-op Pain Management: Minimal or no pain anticipated   Induction: Intravenous  PONV Risk Score and Plan: Propofol infusion  Airway Management Planned: Nasal Cannula and Natural Airway  Additional Equipment:   Intra-op Plan:   Post-operative Plan:   Informed Consent: I have reviewed the patients History and Physical, chart, labs and discussed the procedure including the risks, benefits and alternatives for the proposed anesthesia with the patient or authorized representative who has indicated his/her understanding and acceptance.     Dental advisory given  Plan Discussed with: CRNA and Surgeon  Anesthesia Plan Comments:         Anesthesia Quick Evaluation

## 2021-12-20 NOTE — Transfer of Care (Signed)
Immediate Anesthesia Transfer of Care Note  Patient: Bobby Gross  Procedure(s) Performed: COLONOSCOPY WITH PROPOFOL BIOPSY  Patient Location: Endoscopy Unit  Anesthesia Type:General  Level of Consciousness: awake  Airway & Oxygen Therapy: Patient Spontanous Breathing  Post-op Assessment: Report given to RN and Post -op Vital signs reviewed and stable  Post vital signs: Reviewed and stable  Last Vitals:  Vitals Value Taken Time  BP    Temp    Pulse    Resp    SpO2      Last Pain:  Vitals:   12/20/21 1134  TempSrc: Oral  PainSc: 0-No pain      Patients Stated Pain Goal: 7 (19/37/90 2409)  Complications: No notable events documented.

## 2021-12-20 NOTE — Anesthesia Postprocedure Evaluation (Signed)
Anesthesia Post Note  Patient: Bobby Gross  Procedure(s) Performed: COLONOSCOPY WITH PROPOFOL BIOPSY  Patient location during evaluation: Phase II Anesthesia Type: General Level of consciousness: awake and alert and oriented Pain management: pain level controlled Vital Signs Assessment: post-procedure vital signs reviewed and stable Respiratory status: spontaneous breathing, nonlabored ventilation and respiratory function stable Cardiovascular status: blood pressure returned to baseline and stable Postop Assessment: no apparent nausea or vomiting Anesthetic complications: no   No notable events documented.   Last Vitals:  Vitals:   12/20/21 1201 12/20/21 1205  BP: (!) 68/49 101/60  Pulse: (!) 58 60  Resp: 15 18  Temp: 36.5 C   SpO2: 98% 98%    Last Pain:  Vitals:   12/20/21 1205  TempSrc:   PainSc: 0-No pain                  C 

## 2021-12-21 LAB — SURGICAL PATHOLOGY

## 2021-12-27 ENCOUNTER — Encounter (HOSPITAL_COMMUNITY): Payer: Self-pay | Admitting: Internal Medicine

## 2022-02-12 ENCOUNTER — Other Ambulatory Visit: Payer: Self-pay | Admitting: Gastroenterology

## 2022-02-12 NOTE — Telephone Encounter (Signed)
Pt last seen 11/28/2021 by you. Last ordered 10 months ago by Neil Crouch

## 2022-02-15 ENCOUNTER — Other Ambulatory Visit: Payer: Self-pay

## 2022-02-15 DIAGNOSIS — R748 Abnormal levels of other serum enzymes: Secondary | ICD-10-CM

## 2022-02-15 DIAGNOSIS — K5010 Crohn's disease of large intestine without complications: Secondary | ICD-10-CM

## 2022-02-15 MED ORDER — CYANOCOBALAMIN 1000 MCG/ML IJ SOLN: INTRAMUSCULAR | 11 refills | Status: DC

## 2022-02-15 NOTE — Telephone Encounter (Signed)
Phoned and advised the pt of his Rx being sent in and approved.

## 2022-05-08 DIAGNOSIS — Z9289 Personal history of other medical treatment: Secondary | ICD-10-CM | POA: Diagnosis not present

## 2022-05-08 DIAGNOSIS — I712 Thoracic aortic aneurysm, without rupture, unspecified: Secondary | ICD-10-CM | POA: Diagnosis not present

## 2022-05-08 DIAGNOSIS — I7121 Aneurysm of the ascending aorta, without rupture: Secondary | ICD-10-CM | POA: Diagnosis not present

## 2022-05-31 ENCOUNTER — Encounter: Payer: Self-pay | Admitting: *Deleted

## 2022-06-07 ENCOUNTER — Other Ambulatory Visit: Payer: Self-pay | Admitting: Gastroenterology

## 2022-06-11 DIAGNOSIS — K1120 Sialoadenitis, unspecified: Secondary | ICD-10-CM | POA: Diagnosis not present

## 2022-06-12 ENCOUNTER — Other Ambulatory Visit: Payer: Self-pay | Admitting: Physician Assistant

## 2022-06-12 DIAGNOSIS — K1120 Sialoadenitis, unspecified: Secondary | ICD-10-CM

## 2022-06-13 DIAGNOSIS — E039 Hypothyroidism, unspecified: Secondary | ICD-10-CM | POA: Diagnosis not present

## 2022-06-13 DIAGNOSIS — R5383 Other fatigue: Secondary | ICD-10-CM | POA: Diagnosis not present

## 2022-06-13 DIAGNOSIS — E782 Mixed hyperlipidemia: Secondary | ICD-10-CM | POA: Diagnosis not present

## 2022-06-13 DIAGNOSIS — E7849 Other hyperlipidemia: Secondary | ICD-10-CM | POA: Diagnosis not present

## 2022-06-18 DIAGNOSIS — Z23 Encounter for immunization: Secondary | ICD-10-CM | POA: Diagnosis not present

## 2022-06-18 DIAGNOSIS — I77810 Thoracic aortic ectasia: Secondary | ICD-10-CM | POA: Diagnosis not present

## 2022-06-18 DIAGNOSIS — K1120 Sialoadenitis, unspecified: Secondary | ICD-10-CM | POA: Diagnosis not present

## 2022-06-18 DIAGNOSIS — K5090 Crohn's disease, unspecified, without complications: Secondary | ICD-10-CM | POA: Diagnosis not present

## 2022-06-18 DIAGNOSIS — Z6824 Body mass index (BMI) 24.0-24.9, adult: Secondary | ICD-10-CM | POA: Diagnosis not present

## 2022-06-18 DIAGNOSIS — E7849 Other hyperlipidemia: Secondary | ICD-10-CM | POA: Diagnosis not present

## 2022-06-18 DIAGNOSIS — E039 Hypothyroidism, unspecified: Secondary | ICD-10-CM | POA: Diagnosis not present

## 2022-06-18 DIAGNOSIS — M7062 Trochanteric bursitis, left hip: Secondary | ICD-10-CM | POA: Diagnosis not present

## 2022-06-18 DIAGNOSIS — R030 Elevated blood-pressure reading, without diagnosis of hypertension: Secondary | ICD-10-CM | POA: Diagnosis not present

## 2022-07-02 DIAGNOSIS — L570 Actinic keratosis: Secondary | ICD-10-CM | POA: Diagnosis not present

## 2022-07-02 DIAGNOSIS — D239 Other benign neoplasm of skin, unspecified: Secondary | ICD-10-CM | POA: Diagnosis not present

## 2022-07-10 ENCOUNTER — Telehealth: Payer: Self-pay

## 2022-07-10 NOTE — Telephone Encounter (Signed)
Rx faxed to [facility].

## 2022-07-10 NOTE — Telephone Encounter (Signed)
Rx refill request for Mesalamine DR Tab was received from [facility]. Qty: 120. Pt was last seen on 11/28/2021.

## 2022-07-19 ENCOUNTER — Ambulatory Visit
Admission: RE | Admit: 2022-07-19 | Discharge: 2022-07-19 | Disposition: A | Discharge status: Home / Self Care | Payer: Medicare Other | Source: Ambulatory Visit | Attending: Physician Assistant | Admitting: Physician Assistant

## 2022-07-19 DIAGNOSIS — K1120 Sialoadenitis, unspecified: Secondary | ICD-10-CM | POA: Diagnosis not present

## 2022-07-19 DIAGNOSIS — J392 Other diseases of pharynx: Secondary | ICD-10-CM | POA: Diagnosis not present

## 2022-07-19 DIAGNOSIS — R590 Localized enlarged lymph nodes: Secondary | ICD-10-CM | POA: Diagnosis not present

## 2022-07-19 MED ORDER — IOPAMIDOL (ISOVUE-300) INJECTION 61%
70.0000 mL | Freq: Once | INTRAVENOUS | Status: AC | PRN
  Administered 2022-07-19: 70 mL via INTRAVENOUS

## 2022-08-07 ENCOUNTER — Encounter: Payer: Self-pay | Admitting: Internal Medicine

## 2022-08-07 ENCOUNTER — Ambulatory Visit (INDEPENDENT_AMBULATORY_CARE_PROVIDER_SITE_OTHER): Payer: Medicare Other | Admitting: Internal Medicine

## 2022-08-07 VITALS — BP 135/79 | HR 64 | Temp 98.0°F | Ht 72.0 in | Wt 180.0 lb

## 2022-08-07 DIAGNOSIS — Z1211 Encounter for screening for malignant neoplasm of colon: Secondary | ICD-10-CM | POA: Diagnosis not present

## 2022-08-07 DIAGNOSIS — K5010 Crohn's disease of large intestine without complications: Secondary | ICD-10-CM

## 2022-08-07 NOTE — Progress Notes (Unsigned)
Primary Care Physician:  Curlene Labrum, MD Primary Gastroenterologist:  Dr. [name]  Pre-Procedure History & Physical: HPI:  Bobby Gross is a 67 y.o. male here for follow-up of ileocolonic Crohn's disease.  Noncritical stenosis found at the anastomosis on colonoscopy last year.  Appeared to have inactive disease.  And activity on histology as well.;  Due for repeat examination in 5 years.  Doing well 1 bowel movement daily.  He tried to taper down on the Lialda but started having abdominal cramps went back to 4.8 g daily is doing well.  Normal DEXA scan 2022; be due for repeat 2025.  Continues on vitamin B12 supplementation no abdominal pain no fecal urgency having 1-2 formed bowel movements daily.  Past Medical History:  Diagnosis Date   B12 deficiency    Following small bowel resection   Crohn's disease ([place])    Involving small bowel   History of nephrolithiasis    Hyperlipemia     Past Surgical History:  Procedure Laterality Date   BIOPSY  01/31/2017   Procedure: BIOPSY;  Surgeon: [name], MD;  [facility];  Service: Endoscopy;;  biopsy     BIOPSY  12/20/2021   Procedure: BIOPSY;  Surgeon: [name], MD;  [facility];  Service: Endoscopy;;  biopsies of anastomosis(stricture)   CERVICAL SPINE SURGERY  11/07/2021   C5/6 disc replacement   COLONOSCOPY  10/03/2006   Normal rectum/Status post right hemicolectomy with a small bowel anastomosis,  fibrotic-appearing anastomosis consistent with Crohn's, status post  biopsy, but overall, things look really good today   COLONOSCOPY  01/28/2012   Dr. Sabino Gasser R hemicolectomy for ileocolonic crohn's disease. disease appears inactive at this time.  all Bx's benign.   COLONOSCOPY N/A 01/31/2017   Procedure: COLONOSCOPY;  Surgeon: [name], MD;  [facility];  Service: Endoscopy;  Laterality: N/A;  7:30am   COLONOSCOPY WITH PROPOFOL N/A 12/20/2021   Procedure: COLONOSCOPY WITH  PROPOFOL;  Surgeon: [name], MD;  [facility];  Service: Endoscopy;  Laterality: N/A;  11:15am   DEXA scan  09/2010   LEFT HEART CATH AND CORONARY ANGIOGRAPHY N/A 04/10/2017   Procedure: LEFT HEART CATH AND CORONARY ANGIOGRAPHY;  Surgeon: Belva Crome, MD;  Location: [place] [facility];  Service: Cardiovascular;  Laterality: N/A;   RIGHT FOOT SURGER  2009   PLANTAR FASCITIS   RIGHT SHOULDER SURGERY     RIGHT WRIST SURGERY     SMALL INTESTINE SURGERY  1988   AT [facility]    Prior to Admission medications   Medication Sig Start Date End Date Taking? Authorizing Provider  cetirizine (ZYRTEC) 10 MG tablet Take 10 mg by mouth daily as needed for allergies.   Yes [provider]  cyanocobalamin (,VITAMIN B-12,) 1000 MCG/ML injection inject ONE ML into THE muscle every 30 DAYS 02/15/22  Yes , [name], MD  doxazosin (CARDURA) 2 MG tablet Take 2 mg by mouth daily. 10/03/21  Yes [provider]  levothyroxine (SYNTHROID) 88 MCG tablet Take 88 mcg by mouth every evening. 09/22/18  Yes [provider]  LIALDA 1.2 g EC tablet TAKE 4 TABLETS BY MOUTH DAILY 06/13/22  Yes Annitta Needs, NP  Multiple Vitamin (MULTIVITAMIN WITH MINERALS) TABS Take 1 tablet by mouth daily.   Yes [provider]    Allergies as of 08/07/2022   (No Known Allergies)    Family History  Problem Relation Age  of Onset   Colon cancer Other     Social History   Socioeconomic History   Marital status: Married    Spouse name: Not on file   Number of children: Not on file   Years of education: Not on file   Highest education level: Not on file  Occupational History   Not on file  Tobacco Use   Smoking status: Never   Smokeless tobacco: Never   Tobacco comments:    Never smoker  Vaping Use   Vaping Use: Never used  Substance and Sexual Activity   Alcohol use: No   Drug use: No   Sexual activity: Yes  Other Topics Concern   Not on file  Social History  Narrative   Not on file   Social Determinants of Health   Financial Resource Strain: Not on file  Food Insecurity: Not on file  Transportation Needs: Not on file  Physical Activity: Not on file  Stress: Not on file  Social Connections: Not on file  Intimate Partner Violence: Not on file    Review of Systems: See HPI, otherwise negative ROS  Physical Exam: BP 135/79 (BP Location: Right Arm, Patient Position: Sitting, Cuff Size: Large)   Pulse 64   Temp 98 F (36.7 C) (Oral)   Ht 6' (1.829 m)   Wt 180 lb (81.6 kg)   SpO2 99%   BMI 24.41 kg/m  General:   Alert,  Well-developed, well-nourished, pleasant and cooperative in NAD Neck:  Supple; no masses or thyromegaly. No significant cervical adenopathy. Lungs:  Clear throughout to auscultation.   No wheezes, crackles, or rhonchi. No acute distress. Heart:  Regular rate and rhythm; no murmurs, clicks, rubs,  or gallops. Abdomen: Non-distended, normal bowel sounds.  Soft and nontender without appreciable mass or hepatosplenomegaly.  Pulses:  Normal pulses noted. Extremities:  Without clubbing or edema.  Impression/Plan: Ileocolonic Crohn's disease in remission.  He had significant relief in disease burden with surgery decades ago. Off label what Lialda has with his disease and control.  He did try to back off last year with an apparent flare.  Overall, clinically doing very well with current regimen.     Notice: This dictation was prepared with Dragon dictation along with smaller phrase technology. Any transcriptional errors that result from this process are unintentional and may not be corrected upon review.

## 2022-08-07 NOTE — Patient Instructions (Signed)
It was good seeing you again today!  Continue Lialda -full 4.8 g daily  Will plan for a bone density study [place] for surveillance colonoscopy 2028  Continue to avoid nonsteroidal agents as much as possible  Continue B12 supplementation  Office visit in 1 year

## 2022-09-17 ENCOUNTER — Other Ambulatory Visit: Payer: Self-pay | Admitting: Internal Medicine

## 2022-10-17 DIAGNOSIS — M7062 Trochanteric bursitis, left hip: Secondary | ICD-10-CM | POA: Diagnosis not present

## 2022-10-17 DIAGNOSIS — Z6824 Body mass index (BMI) 24.0-24.9, adult: Secondary | ICD-10-CM | POA: Diagnosis not present

## 2022-10-17 DIAGNOSIS — M170 Bilateral primary osteoarthritis of knee: Secondary | ICD-10-CM | POA: Diagnosis not present

## 2022-10-17 DIAGNOSIS — E039 Hypothyroidism, unspecified: Secondary | ICD-10-CM | POA: Diagnosis not present

## 2022-10-17 DIAGNOSIS — R030 Elevated blood-pressure reading, without diagnosis of hypertension: Secondary | ICD-10-CM | POA: Diagnosis not present

## 2022-12-11 DIAGNOSIS — Z0001 Encounter for general adult medical examination with abnormal findings: Secondary | ICD-10-CM | POA: Diagnosis not present

## 2022-12-11 DIAGNOSIS — E039 Hypothyroidism, unspecified: Secondary | ICD-10-CM | POA: Diagnosis not present

## 2022-12-11 DIAGNOSIS — R5383 Other fatigue: Secondary | ICD-10-CM | POA: Diagnosis not present

## 2022-12-11 DIAGNOSIS — E7849 Other hyperlipidemia: Secondary | ICD-10-CM | POA: Diagnosis not present

## 2022-12-18 DIAGNOSIS — E7849 Other hyperlipidemia: Secondary | ICD-10-CM | POA: Diagnosis not present

## 2022-12-18 DIAGNOSIS — E039 Hypothyroidism, unspecified: Secondary | ICD-10-CM | POA: Diagnosis not present

## 2022-12-18 DIAGNOSIS — Z6824 Body mass index (BMI) 24.0-24.9, adult: Secondary | ICD-10-CM | POA: Diagnosis not present

## 2022-12-18 DIAGNOSIS — R944 Abnormal results of kidney function studies: Secondary | ICD-10-CM | POA: Diagnosis not present

## 2022-12-18 DIAGNOSIS — M7062 Trochanteric bursitis, left hip: Secondary | ICD-10-CM | POA: Diagnosis not present

## 2022-12-18 DIAGNOSIS — R030 Elevated blood-pressure reading, without diagnosis of hypertension: Secondary | ICD-10-CM | POA: Diagnosis not present

## 2022-12-18 DIAGNOSIS — S6991XA Unspecified injury of right wrist, hand and finger(s), initial encounter: Secondary | ICD-10-CM | POA: Diagnosis not present

## 2022-12-18 DIAGNOSIS — R7303 Prediabetes: Secondary | ICD-10-CM | POA: Diagnosis not present

## 2022-12-18 DIAGNOSIS — K5090 Crohn's disease, unspecified, without complications: Secondary | ICD-10-CM | POA: Diagnosis not present

## 2022-12-18 DIAGNOSIS — S67190S Crushing injury of right index finger, sequela: Secondary | ICD-10-CM | POA: Diagnosis not present

## 2022-12-18 DIAGNOSIS — I77810 Thoracic aortic ectasia: Secondary | ICD-10-CM | POA: Diagnosis not present

## 2022-12-31 DIAGNOSIS — M7062 Trochanteric bursitis, left hip: Secondary | ICD-10-CM | POA: Diagnosis not present

## 2022-12-31 DIAGNOSIS — L570 Actinic keratosis: Secondary | ICD-10-CM | POA: Diagnosis not present

## 2022-12-31 DIAGNOSIS — M6281 Muscle weakness (generalized): Secondary | ICD-10-CM | POA: Diagnosis not present

## 2022-12-31 DIAGNOSIS — M25552 Pain in left hip: Secondary | ICD-10-CM | POA: Diagnosis not present

## 2022-12-31 DIAGNOSIS — D485 Neoplasm of uncertain behavior of skin: Secondary | ICD-10-CM | POA: Diagnosis not present

## 2023-01-08 DIAGNOSIS — M6281 Muscle weakness (generalized): Secondary | ICD-10-CM | POA: Diagnosis not present

## 2023-01-08 DIAGNOSIS — M7062 Trochanteric bursitis, left hip: Secondary | ICD-10-CM | POA: Diagnosis not present

## 2023-01-08 DIAGNOSIS — M25552 Pain in left hip: Secondary | ICD-10-CM | POA: Diagnosis not present

## 2023-01-21 DIAGNOSIS — M7062 Trochanteric bursitis, left hip: Secondary | ICD-10-CM | POA: Diagnosis not present

## 2023-01-21 DIAGNOSIS — M6281 Muscle weakness (generalized): Secondary | ICD-10-CM | POA: Diagnosis not present

## 2023-01-21 DIAGNOSIS — M25552 Pain in left hip: Secondary | ICD-10-CM | POA: Diagnosis not present

## 2023-01-28 DIAGNOSIS — M7602 Gluteal tendinitis, left hip: Secondary | ICD-10-CM | POA: Diagnosis not present

## 2023-01-28 DIAGNOSIS — S73192A Other sprain of left hip, initial encounter: Secondary | ICD-10-CM | POA: Diagnosis not present

## 2023-01-28 DIAGNOSIS — M7062 Trochanteric bursitis, left hip: Secondary | ICD-10-CM | POA: Diagnosis not present

## 2023-02-28 DIAGNOSIS — M7602 Gluteal tendinitis, left hip: Secondary | ICD-10-CM | POA: Diagnosis not present

## 2023-03-20 DIAGNOSIS — R3912 Poor urinary stream: Secondary | ICD-10-CM | POA: Diagnosis not present

## 2023-04-16 ENCOUNTER — Other Ambulatory Visit: Payer: Self-pay | Admitting: Internal Medicine

## 2023-04-16 ENCOUNTER — Other Ambulatory Visit: Payer: Self-pay | Admitting: Gastroenterology

## 2023-04-24 NOTE — Progress Notes (Addendum)
COVID Vaccine Completed:  Date of COVID positive in last 90 days:  PCP - Quintin Alto, MD Cardiologist -   Chest x-ray -  EKG -  Stress Test -  ECHO - 03/09/20 [facility] Cardiac Cath - 04/10/17 Epic Pacemaker/ICD device last checked: Spinal Cord Stimulator:  Bowel Prep -   Sleep Study -  CPAP -   Fasting Blood Sugar - preDM Checks Blood Sugar _____ times a day  Last dose of GLP1 agonist-  N/A GLP1 instructions:  N/A   Last dose of SGLT-2 inhibitors-  N/A SGLT-2 instructions: N/A   Blood Thinner Instructions:  Time Aspirin Instructions: Last Dose:  Activity level:  Can go up a flight of stairs and perform activities of daily living without stopping and without symptoms of chest pain or shortness of breath.  Able to exercise without symptoms  Unable to go up a flight of stairs without symptoms of     Anesthesia review: aortic valve sclerosis,   Patient denies shortness of breath, fever, cough and chest pain at PAT appointment  Patient verbalized understanding of instructions that were given to them at the PAT appointment. Patient was also instructed that they will need to review over the PAT instructions again at home before surgery.

## 2023-04-24 NOTE — Patient Instructions (Signed)
SURGICAL WAITING ROOM VISITATION  Patients having surgery or a procedure may have no more than 2 support people in the waiting area - these visitors may rotate.    Children under the age of 59 must have an adult with them who is not the patient.  Due to an increase in RSV and influenza rates and associated hospitalizations, children ages 36 and under may not visit patients in [facility] hospitals.  If the patient needs to stay at the hospital during part of their recovery, the visitor guidelines for inpatient rooms apply. Pre-op nurse will coordinate an appropriate time for 1 support person to accompany patient in pre-op.  This support person may not rotate.    Please refer to the [facility] website for the visitor guidelines for Inpatients (after your surgery is over and you are in a regular room).    Your procedure is scheduled on: 05/10/23   Report to [facility] Main Entrance    Report to admitting at 7:30 AM   Call this number if you have problems the morning of surgery [phone]   Do not eat food :After Midnight.   After Midnight you may have the following liquids until 5:15 AM DAY OF SURGERY  Water Non-Citrus Juices (without pulp, NO RED-Apple, White grape, White cranberry) Black Coffee (NO MILK/CREAM OR CREAMERS, sugar ok)  Clear Tea (NO MILK/CREAM OR CREAMERS, sugar ok) regular and decaf                             Plain Jell-O (NO RED)                                           Fruit ices (not with fruit pulp, NO RED)                                     Popsicles (NO RED)                                                               Sports drinks like Gatorade (NO RED)           The day of surgery:  Drink ONE (1) Pre-Surgery G2 at 5:15 AM the morning of surgery. Drink in one sitting. Do not sip.  This drink was given to you during your hospital  pre-op appointment visit. Nothing else to drink after completing the  Pre-Surgery G2.          If you have  questions, please contact your surgeon's office.   FOLLOW BOWEL PREP AND ANY ADDITIONAL PRE OP INSTRUCTIONS YOU RECEIVED FROM YOUR SURGEON'S OFFICE!!!     Oral Hygiene is also important to reduce your risk of infection.                                    Remember - BRUSH YOUR TEETH THE MORNING OF SURGERY WITH YOUR REGULAR TOOTHPASTE  DENTURES WILL BE REMOVED PRIOR TO SURGERY PLEASE DO NOT  APPLY "Poly grip" OR ADHESIVES!!!   Stop all vitamins and herbal supplements 7 days before surgery.   Take these medicines the morning of surgery with A SIP OF WATER: Zyrtec                              You may not have any metal on your body including jewelry, and body piercing             Do not wear lotions, powders, cologne, or deodorant              Men may shave face and neck.   Do not bring valuables to the hospital. [name] IS NOT             RESPONSIBLE   FOR VALUABLES.   Contacts, glasses, dentures or bridgework may not be worn into surgery.  DO NOT BRING YOUR HOME MEDICATIONS TO THE HOSPITAL. PHARMACY WILL DISPENSE MEDICATIONS LISTED ON YOUR MEDICATION LIST TO YOU DURING YOUR ADMISSION IN THE HOSPITAL!    Patients discharged on the day of surgery will not be allowed to drive home.  Someone NEEDS to stay with you for the first 24 hours after anesthesia.   Special Instructions: Bring a copy of your healthcare power of attorney and living will documents the day of surgery if you haven't scanned them before.              Please read over the following fact sheets you were given: IF YOU HAVE QUESTIONS ABOUT YOUR PRE-OP INSTRUCTIONS PLEASE CALL [name]    If you received a COVID test during your pre-op visit  it is requested that you wear a mask when out in public, stay away from anyone that may not be feeling well and notify your surgeon if you develop symptoms. If you test positive for Covid or have been in contact with anyone that has tested positive in the last 10 days  please notify you surgeon.    [name] - Preparing for Surgery Before surgery, you can play an important role.  Because skin is not sterile, your skin needs to be as free of germs as possible.  You can reduce the number of germs on your skin by washing with CHG (chlorahexidine gluconate) soap before surgery.  CHG is an antiseptic cleaner which kills germs and bonds with the skin to continue killing germs even after washing. Please DO NOT use if you have an allergy to CHG or antibacterial soaps.  If your skin becomes reddened/irritated stop using the CHG and inform your nurse when you arrive at Short Stay. Do not shave (including legs and underarms) for at least 48 hours prior to the first CHG shower.  You may shave your face/neck.  Please follow these instructions carefully:  1.  Shower with CHG Soap the night before surgery and the  morning of surgery.  2.  If you choose to wash your hair, wash your hair first as usual with your normal  shampoo.  3.  After you shampoo, rinse your hair and body thoroughly to remove the shampoo.                             4.  Use CHG as you would any other liquid soap.  You can apply chg directly to the skin and wash.  Gently with a scrungie or clean washcloth.  5.  Apply the CHG Soap to your body ONLY FROM THE NECK DOWN.   Do   not use on face/ open                           Wound or open sores. Avoid contact with eyes, ears mouth and   genitals (private parts).                       Wash face,  Genitals (private parts) with your normal soap.             6.  Wash thoroughly, paying special attention to the area where your    surgery  will be performed.  7.  Thoroughly rinse your body with warm water from the neck down.  8.  DO NOT shower/wash with your normal soap after using and rinsing off the CHG Soap.                9.  Pat yourself dry with a clean towel.            10.  Wear clean pajamas.            11.  Place clean sheets on your bed the night of your  first shower and do not  sleep with pets. Day of Surgery : Do not apply any lotions/deodorants the morning of surgery.  Please wear clean clothes to the hospital/surgery center.  FAILURE TO FOLLOW THESE INSTRUCTIONS MAY RESULT IN THE CANCELLATION OF YOUR SURGERY  PATIENT SIGNATURE_________________________________  NURSE SIGNATURE__________________________________  ________________________________________________________________________  Bobby Gross  An incentive spirometer is a tool that can help keep your lungs clear and active. This tool measures how well you are filling your lungs with each breath. Taking long deep breaths may help reverse or decrease the chance of developing breathing (pulmonary) problems (especially infection) following: A long period of time when you are unable to move or be active. BEFORE THE PROCEDURE  If the spirometer includes an indicator to show your best effort, your nurse or respiratory therapist will set it to a desired goal. If possible, sit up straight or lean slightly forward. Try not to slouch. Hold the incentive spirometer in an upright position. INSTRUCTIONS FOR USE  Sit on the edge of your bed if possible, or sit up as far as you can in bed or on a chair. Hold the incentive spirometer in an upright position. Breathe out normally. Place the mouthpiece in your mouth and seal your lips tightly around it. Breathe in slowly and as deeply as possible, raising the piston or the ball toward the top of the column. Hold your breath for 3-5 seconds or for as long as possible. Allow the piston or ball to fall to the bottom of the column. Remove the mouthpiece from your mouth and breathe out normally. Rest for a few seconds and repeat Steps 1 through 7 at least 10 times every 1-2 hours when you are awake. Take your time and take a few normal breaths between deep breaths. The spirometer may include an indicator to show your best effort. Use the indicator  as a goal to work toward during each repetition. After each set of 10 deep breaths, practice coughing to be sure your lungs are clear. If you have an incision (the cut made at the time of surgery), support your incision when coughing by placing a pillow or rolled up towels firmly against  it. Once you are able to get out of bed, walk around indoors and cough well. You may stop using the incentive spirometer when instructed by your caregiver.  RISKS AND COMPLICATIONS Take your time so you do not get dizzy or light-headed. If you are in pain, you may need to take or ask for pain medication before doing incentive spirometry. It is harder to take a deep breath if you are having pain. AFTER USE Rest and breathe slowly and easily. It can be helpful to keep track of a log of your progress. Your caregiver can provide you with a simple table to help with this. If you are using the spirometer at home, follow these instructions: SEEK MEDICAL CARE IF:  You are having difficultly using the spirometer. You have trouble using the spirometer as often as instructed. Your pain medication is not giving enough relief while using the spirometer. You develop fever of 100.5 F (38.1 C) or higher. SEEK IMMEDIATE MEDICAL CARE IF:  You cough up bloody sputum that had not been present before. You develop fever of 102 F (38.9 C) or greater. You develop worsening pain at or near the incision site. MAKE SURE YOU:  Understand these instructions. Will watch your condition. Will get help right away if you are not doing well or get worse. Document Released: 11/26/2006 Document Revised: 10/08/2011 Document Reviewed: 01/27/2007 [facility] Patient Information 2014 [place], [place].   ________________________________________________________________________

## 2023-04-25 ENCOUNTER — Encounter (HOSPITAL_COMMUNITY)
Admission: RE | Admit: 2023-04-25 | Discharge: 2023-04-25 | Disposition: A | Discharge status: Home / Self Care | Payer: Medicare Other | Source: Ambulatory Visit | Attending: Orthopedic Surgery | Admitting: Orthopedic Surgery

## 2023-04-25 ENCOUNTER — Encounter (HOSPITAL_COMMUNITY): Payer: Self-pay

## 2023-04-25 ENCOUNTER — Other Ambulatory Visit: Payer: Self-pay

## 2023-04-25 VITALS — BP 152/80 | HR 70 | Temp 98.2°F | Resp 18 | Ht 72.0 in | Wt 170.0 lb

## 2023-04-25 DIAGNOSIS — Z01812 Encounter for preprocedural laboratory examination: Secondary | ICD-10-CM | POA: Insufficient documentation

## 2023-04-25 DIAGNOSIS — I200 Unstable angina: Secondary | ICD-10-CM | POA: Diagnosis not present

## 2023-04-25 DIAGNOSIS — Z01818 Encounter for other preprocedural examination: Secondary | ICD-10-CM | POA: Diagnosis present

## 2023-04-25 HISTORY — DX: Personal history of urinary calculi: Z87.442

## 2023-04-25 HISTORY — DX: Hypothyroidism, unspecified: E03.9

## 2023-04-25 HISTORY — DX: Malignant (primary) neoplasm, unspecified: C80.1

## 2023-04-25 LAB — BASIC METABOLIC PANEL
Anion gap: 10 (ref 5–15)
BUN: 8 mg/dL (ref 8–23)
CO2: 20 mmol/L — ABNORMAL LOW (ref 22–32)
Calcium: 9.4 mg/dL (ref 8.9–10.3)
Chloride: 107 mmol/L (ref 98–111)
Creatinine, Ser: 1.02 mg/dL (ref 0.61–1.24)
GFR, Estimated: >60 mL/min (ref >60)
Glucose, Bld: 101 mg/dL — ABNORMAL HIGH (ref 70–99)
Potassium: 3.9 mmol/L (ref 3.5–5.1)
Sodium: 137 mmol/L (ref 135–145)

## 2023-04-25 LAB — CBC
HCT: 44.5 % (ref 39.0–52.0)
Hemoglobin: 14.2 g/dL (ref 13.0–17.0)
MCH: 28.2 pg (ref 26.0–34.0)
MCHC: 31.9 g/dL (ref 30.0–36.0)
MCV: 88.5 fL (ref 80.0–100.0)
Platelets: 228 10*3/uL (ref 150–400)
RBC: 5.03 MIL/uL (ref 4.22–5.81)
RDW: 15.6 % — ABNORMAL HIGH (ref 11.5–15.5)
WBC: 5.4 10*3/uL (ref 4.0–10.5)
nRBC: 0 % (ref 0.0–0.2)

## 2023-05-10 ENCOUNTER — Other Ambulatory Visit (HOSPITAL_COMMUNITY): Payer: Self-pay

## 2023-05-10 ENCOUNTER — Other Ambulatory Visit: Payer: Self-pay

## 2023-05-10 ENCOUNTER — Ambulatory Visit (HOSPITAL_COMMUNITY)
Admission: RE | Admit: 2023-05-10 | Discharge: 2023-05-10 | Disposition: A | Discharge status: Home / Self Care | Payer: Medicare Other | Source: Ambulatory Visit | Attending: Orthopedic Surgery | Admitting: Orthopedic Surgery

## 2023-05-10 ENCOUNTER — Encounter (HOSPITAL_COMMUNITY): Payer: Self-pay | Admitting: Orthopedic Surgery

## 2023-05-10 ENCOUNTER — Ambulatory Visit (HOSPITAL_COMMUNITY): Payer: Medicare Other | Admitting: Physician Assistant

## 2023-05-10 ENCOUNTER — Ambulatory Visit (HOSPITAL_BASED_OUTPATIENT_CLINIC_OR_DEPARTMENT_OTHER): Payer: Medicare Other | Admitting: Physician Assistant

## 2023-05-10 ENCOUNTER — Encounter (HOSPITAL_COMMUNITY)
Admission: RE | Disposition: A | Discharge status: Home / Self Care | Payer: Self-pay | Source: Ambulatory Visit | Attending: Orthopedic Surgery

## 2023-05-10 DIAGNOSIS — S76311A Strain of muscle, fascia and tendon of the posterior muscle group at thigh level, right thigh, initial encounter: Secondary | ICD-10-CM | POA: Diagnosis not present

## 2023-05-10 DIAGNOSIS — S76012A Strain of muscle, fascia and tendon of left hip, initial encounter: Secondary | ICD-10-CM | POA: Diagnosis not present

## 2023-05-10 DIAGNOSIS — M7602 Gluteal tendinitis, left hip: Secondary | ICD-10-CM | POA: Diagnosis not present

## 2023-05-10 DIAGNOSIS — E039 Hypothyroidism, unspecified: Secondary | ICD-10-CM | POA: Diagnosis not present

## 2023-05-10 DIAGNOSIS — K5090 Crohn's disease, unspecified, without complications: Secondary | ICD-10-CM | POA: Diagnosis not present

## 2023-05-10 DIAGNOSIS — X58XXXA Exposure to other specified factors, initial encounter: Secondary | ICD-10-CM | POA: Insufficient documentation

## 2023-05-10 DIAGNOSIS — M7062 Trochanteric bursitis, left hip: Secondary | ICD-10-CM | POA: Diagnosis not present

## 2023-05-10 HISTORY — PX: OPEN SURGICAL REPAIR OF GLUTEAL TENDON: SHX5995

## 2023-05-10 SURGERY — REPAIR, TENDON, GLUTEUS MEDIUS, OPEN
Anesthesia: General | Site: Hip | Laterality: Left

## 2023-05-10 MED ORDER — OXYCODONE HCL 5 MG PO TABS
5.0000 mg | ORAL_TABLET | Freq: Four times a day (QID) | ORAL | 0 refills | Status: DC | PRN
Start: 2023-05-10 — End: 2023-11-19
  Filled 2023-05-10: qty 22, 6d supply, fill #0

## 2023-05-10 MED ORDER — DEXAMETHASONE SODIUM PHOSPHATE 10 MG/ML IJ SOLN
INTRAMUSCULAR | Status: DC | PRN
  Administered 2023-05-10: 4 mg via INTRAVENOUS

## 2023-05-10 MED ORDER — FENTANYL CITRATE (PF) 100 MCG/2ML IJ SOLN
INTRAMUSCULAR | Status: AC
  Filled 2023-05-10: qty 2

## 2023-05-10 MED ORDER — CHLORHEXIDINE GLUCONATE 0.12 % MT SOLN
15.0000 mL | Freq: Once | OROMUCOSAL | Status: AC
  Administered 2023-05-10: 15 mL via OROMUCOSAL

## 2023-05-10 MED ORDER — ONDANSETRON HCL 4 MG/2ML IJ SOLN
INTRAMUSCULAR | Status: AC
  Filled 2023-05-10: qty 2

## 2023-05-10 MED ORDER — PROPOFOL 10 MG/ML IV BOLUS
INTRAVENOUS | Status: DC | PRN
  Administered 2023-05-10: 150 mg via INTRAVENOUS

## 2023-05-10 MED ORDER — DROPERIDOL 2.5 MG/ML IJ SOLN: 0.6250 mg | Freq: Once | INTRAMUSCULAR | Status: DC | PRN

## 2023-05-10 MED ORDER — FENTANYL CITRATE PF 50 MCG/ML IJ SOSY
25.0000 ug | PREFILLED_SYRINGE | INTRAMUSCULAR | Status: DC | PRN
  Administered 2023-05-10: 50 ug via INTRAVENOUS

## 2023-05-10 MED ORDER — FENTANYL CITRATE (PF) 100 MCG/2ML IJ SOLN
INTRAMUSCULAR | Status: DC | PRN
  Administered 2023-05-10: 100 ug via INTRAVENOUS

## 2023-05-10 MED ORDER — SUGAMMADEX SODIUM 200 MG/2ML IV SOLN
INTRAVENOUS | Status: DC | PRN
  Administered 2023-05-10: 200 mg via INTRAVENOUS

## 2023-05-10 MED ORDER — ROCURONIUM BROMIDE 10 MG/ML (PF) SYRINGE
PREFILLED_SYRINGE | INTRAVENOUS | Status: AC
  Filled 2023-05-10: qty 10

## 2023-05-10 MED ORDER — OXYCODONE HCL 5 MG/5ML PO SOLN: 5.0000 mg | Freq: Once | ORAL | Status: AC | PRN

## 2023-05-10 MED ORDER — LACTATED RINGERS IV SOLN: INTRAVENOUS | Status: DC

## 2023-05-10 MED ORDER — EPHEDRINE 5 MG/ML INJ
INTRAVENOUS | Status: AC
  Filled 2023-05-10: qty 10

## 2023-05-10 MED ORDER — MIDAZOLAM HCL 2 MG/2ML IJ SOLN
INTRAMUSCULAR | Status: DC | PRN
  Administered 2023-05-10: 2 mg via INTRAVENOUS

## 2023-05-10 MED ORDER — BUPIVACAINE-EPINEPHRINE 0.25% -1:200000 IJ SOLN
INTRAMUSCULAR | Status: AC
  Filled 2023-05-10: qty 1

## 2023-05-10 MED ORDER — ONDANSETRON HCL 4 MG/2ML IJ SOLN
INTRAMUSCULAR | Status: DC | PRN
  Administered 2023-05-10: 4 mg via INTRAVENOUS

## 2023-05-10 MED ORDER — CEFAZOLIN SODIUM-DEXTROSE 2-4 GM/100ML-% IV SOLN
2.0000 g | INTRAVENOUS | Status: AC
  Administered 2023-05-10: 2 g via INTRAVENOUS
  Filled 2023-05-10: qty 100

## 2023-05-10 MED ORDER — ORAL CARE MOUTH RINSE: 15.0000 mL | Freq: Once | OROMUCOSAL | Status: AC

## 2023-05-10 MED ORDER — OXYCODONE HCL 5 MG PO TABS
5.0000 mg | ORAL_TABLET | Freq: Once | ORAL | Status: AC | PRN
  Administered 2023-05-10: 5 mg via ORAL

## 2023-05-10 MED ORDER — ACETAMINOPHEN 10 MG/ML IV SOLN
INTRAVENOUS | Status: AC
  Administered 2023-05-10: 1000 mg via INTRAVENOUS
  Filled 2023-05-10: qty 100

## 2023-05-10 MED ORDER — 0.9 % SODIUM CHLORIDE (POUR BTL) OPTIME
TOPICAL | Status: DC | PRN
Start: 2023-05-10 — End: 2023-05-10
  Administered 2023-05-10: 1000 mL

## 2023-05-10 MED ORDER — ROCURONIUM BROMIDE 10 MG/ML (PF) SYRINGE
PREFILLED_SYRINGE | INTRAVENOUS | Status: DC | PRN
  Administered 2023-05-10: 70 mg via INTRAVENOUS

## 2023-05-10 MED ORDER — FENTANYL CITRATE PF 50 MCG/ML IJ SOSY
PREFILLED_SYRINGE | INTRAMUSCULAR | Status: AC
  Administered 2023-05-10: 50 ug via INTRAVENOUS
  Filled 2023-05-10: qty 1

## 2023-05-10 MED ORDER — EPHEDRINE SULFATE-NACL 50-0.9 MG/10ML-% IV SOSY
PREFILLED_SYRINGE | INTRAVENOUS | Status: DC | PRN
  Administered 2023-05-10: 5 mg via INTRAVENOUS
  Administered 2023-05-10 (×2): 10 mg via INTRAVENOUS
  Administered 2023-05-10 (×3): 5 mg via INTRAVENOUS

## 2023-05-10 MED ORDER — TRANEXAMIC ACID-NACL 1000-0.7 MG/100ML-% IV SOLN
1000.0000 mg | INTRAVENOUS | Status: AC
  Administered 2023-05-10: 1000 mg via INTRAVENOUS
  Filled 2023-05-10: qty 100

## 2023-05-10 MED ORDER — BUPIVACAINE-EPINEPHRINE (PF) 0.25% -1:200000 IJ SOLN
INTRAMUSCULAR | Status: DC | PRN
  Administered 2023-05-10: 30 mL

## 2023-05-10 MED ORDER — FENTANYL CITRATE PF 50 MCG/ML IJ SOSY
PREFILLED_SYRINGE | INTRAMUSCULAR | Status: AC
  Administered 2023-05-10: 50 ug via INTRAVENOUS
  Filled 2023-05-10: qty 2

## 2023-05-10 MED ORDER — ACETAMINOPHEN 10 MG/ML IV SOLN: 1000.0000 mg | Freq: Once | INTRAVENOUS | Status: DC | PRN

## 2023-05-10 MED ORDER — MIDAZOLAM HCL 2 MG/2ML IJ SOLN
INTRAMUSCULAR | Status: AC
  Filled 2023-05-10: qty 2

## 2023-05-10 MED ORDER — ONDANSETRON HCL 4 MG PO TABS
4.0000 mg | ORAL_TABLET | Freq: Three times a day (TID) | ORAL | 0 refills | Status: DC | PRN
  Filled 2023-05-10: qty 20, 7d supply, fill #0

## 2023-05-10 MED ORDER — PROPOFOL 10 MG/ML IV BOLUS
INTRAVENOUS | Status: AC
  Filled 2023-05-10: qty 20

## 2023-05-10 MED ORDER — LIDOCAINE 2% (20 MG/ML) 5 ML SYRINGE
INTRAMUSCULAR | Status: DC | PRN
  Administered 2023-05-10: 80 mg via INTRAVENOUS

## 2023-05-10 MED ORDER — OXYCODONE HCL 5 MG PO TABS
ORAL_TABLET | ORAL | Status: AC
  Filled 2023-05-10: qty 1

## 2023-05-10 MED ORDER — DEXAMETHASONE SODIUM PHOSPHATE 10 MG/ML IJ SOLN
INTRAMUSCULAR | Status: AC
  Filled 2023-05-10: qty 1

## 2023-05-10 SURGICAL SUPPLY — 41 items
ANCH SUT 5 FBRTK 2.6 KNTLS SLF (Anchor) ×2 IMPLANT
ANCHOR SUT FBRTK 2.6 SP #5 (Anchor) IMPLANT
ANCHOR SWIVELOCK SP KL 4.75 (Anchor) IMPLANT
BAG COUNTER SPONGE SURGICOUNT (BAG) IMPLANT
BAG SPEC THK2 15X12 ZIP CLS (MISCELLANEOUS)
BAG SPNG CNTER NS LX DISP (BAG)
BAG ZIPLOCK 12X15 (MISCELLANEOUS) ×1 IMPLANT
COVER SURGICAL LIGHT HANDLE (MISCELLANEOUS) ×1 IMPLANT
DRAPE HIP W/POCKET STRL (MISCELLANEOUS) ×1 IMPLANT
DRAPE ORTHO SPLIT 77X108 STRL (DRAPES) ×1
DRAPE SURG 17X11 SM STRL (DRAPES) ×1 IMPLANT
DRAPE SURG ORHT 6 SPLT 77X108 (DRAPES) ×1 IMPLANT
DRAPE U-SHAPE 47X51 STRL (DRAPES) ×1 IMPLANT
DRSG AQUACEL AG ADV 3.5X 6 (GAUZE/BANDAGES/DRESSINGS) IMPLANT
DRSG EMULSION OIL 3X16 NADH (GAUZE/BANDAGES/DRESSINGS) ×1 IMPLANT
DURAPREP 26ML APPLICATOR (WOUND CARE) ×1 IMPLANT
ELECT REM PT RETURN 15FT ADLT (MISCELLANEOUS) ×1 IMPLANT
GLOVE BIOGEL PI IND STRL 8 (GLOVE) ×2 IMPLANT
GOWN STRL REUS W/ TWL XL LVL3 (GOWN DISPOSABLE) ×2 IMPLANT
GOWN STRL REUS W/TWL XL LVL3 (GOWN DISPOSABLE) ×2
KIT ANCHOR FBRTK 2.6 STR (KITS) IMPLANT
KIT BASIN OR (CUSTOM PROCEDURE TRAY) ×1 IMPLANT
KIT TURNOVER KIT A (KITS) IMPLANT
MANIFOLD NEPTUNE II (INSTRUMENTS) ×1 IMPLANT
NDL HYPO 22X1.5 SAFETY MO (MISCELLANEOUS) ×1 IMPLANT
NDL TAPERED W/ NITINOL LOOP (MISCELLANEOUS) IMPLANT
NEEDLE HYPO 22X1.5 SAFETY MO (MISCELLANEOUS) ×1
NEEDLE TAPERED W/ NITINOL LOOP (MISCELLANEOUS) ×1
NS IRRIG 1000ML POUR BTL (IV SOLUTION) ×1 IMPLANT
PACK TOTAL JOINT (CUSTOM PROCEDURE TRAY) ×1 IMPLANT
PIN SET MODULAR GLENOID SYSTEM (PIN) IMPLANT
PROTECTOR NERVE ULNAR (MISCELLANEOUS) ×1 IMPLANT
STAPLER VISISTAT (STAPLE) IMPLANT
STRIP CLOSURE SKIN 1/2X4 (GAUZE/BANDAGES/DRESSINGS) IMPLANT
SUT MNCRL AB 3-0 PS2 18 (SUTURE) ×1 IMPLANT
SUT STRATAFIX 1PDS 45CM VIOLET (SUTURE) ×1 IMPLANT
SUT VIC AB 2-0 CT1 27 (SUTURE) ×2
SUT VIC AB 2-0 CT1 TAPERPNT 27 (SUTURE) ×2 IMPLANT
SYR 30ML LL (SYRINGE) ×1 IMPLANT
TOWEL OR 17X26 10 PK STRL BLUE (TOWEL DISPOSABLE) ×2 IMPLANT
WATER STERILE IRR 1000ML POUR (IV SOLUTION) ×1 IMPLANT

## 2023-05-10 NOTE — Anesthesia Procedure Notes (Signed)
Procedure Name: Intubation Date/Time: 05/10/2023 9:21 AM  Performed by: Sindy Guadeloupe, CRNAPre-anesthesia Checklist: Patient identified, Emergency Drugs available, Suction available, Patient being monitored and Timeout performed Patient Re-evaluated:Patient Re-evaluated prior to induction Oxygen Delivery Method: Circle system utilized Preoxygenation: Pre-oxygenation with 100% oxygen Induction Type: IV induction Ventilation: Mask ventilation without difficulty Laryngoscope Size: Mac and 4 Grade View: Grade II Tube type: Oral Tube size: 7.5 mm Number of attempts: 1 Airway Equipment and Method: Stylet Placement Confirmation: ETT inserted through vocal cords under direct vision, positive ETCO2 and breath sounds checked- equal and bilateral Secured at: 23 cm Tube secured with: Tape Dental Injury: Teeth and Oropharynx as per pre-operative assessment

## 2023-05-10 NOTE — Op Note (Signed)
Date of Surgery: 05/10/2023  INDICATIONS: Bobby Gross is a 67 y.o.-year-old male with a left gluteus medius tendon tear;  The patient did consent to the procedure after discussion of the risks and benefits.  PREOPERATIVE DIAGNOSIS:  1.  Left hip gluteus medius tendon tear 2.  Left hip trochanteric bursitis  POSTOPERATIVE DIAGNOSIS: Same.  PROCEDURE:  1.  Left hip open bursectomy of trochanteric bursa 2.  Left hip open gluteus medius tendon repair with suture anchors  SURGEON: Maryan Rued, M.D.  ASSIST: Dion Saucier, PA-C  Assistant attestation:  PA Mcclung was present for the entire procedure..  ANESTHESIA:  general, with local anesthetic using 1/4% Marcaine with epinephrine 30 cc.  IV FLUIDS AND URINE: See anesthesia.  ESTIMATED BLOOD LOSS: 20 mL.  IMPLANTS:  Athrex 2.6 mm knotless fiber tack suture anchor x 2 Arthrex 4.75 mm self punching swivel lock anchor x 1  DRAINS: None  COMPLICATIONS: None.  DESCRIPTION OF PROCEDURE: The patient was brought to the operating room and placed supine on the operating table.  The patient had been signed prior to the procedure and this was documented. The patient had the anesthesia placed by the anesthesiologist.  A time-out was performed to confirm that this was the correct patient, site, side and location. The patient did receive antibiotics prior to the incision and was re-dosed during the procedure as needed at indicated intervals.  A tourniquet was not placed.  Following induction we then turned the patient into the right lateral decubitus position with the left hip up.  All bony prominences were padded in the standard fashion.  The patient had the operative extremity prepped and draped in the standard surgical fashion.     We began the procedure with a direct lateral approach to the lateral hip and trochanteric region.  Dissection was carried down through skin and subcutaneous fat to the level of the iliotibial band and tensor fascia  lata muscle.  This was excised sharply and deep retractors were placed.  We immediately encountered significant greater trochanteric bursitis.  There was robust inflammatory tissue and significant bursa burden noted.  This was excised sharply with knife and electrocautery Bovie.  Next, we bluntly dissected through the gluteus maximus muscle in line with muscle fibers to reveal the tip of the greater trochanter.  There was no full-thickness tear of the gluteus medius but on palpation there was a defect just off of the anterior aspect of the gluteus medius tendons insertion to the greater trochanter.  We then debrided some of the degenerative tissue there and began our partial repair technique.  We utilized a trans tendinous technique with Arthrex 2.6 mm fiber tack knotless suture anchors.  1 was placed on the anterior aspect of the tear penetrating the healthy tissue to bring this back down to bone.  The second anchor was placed more posterior on the greater trochanter along the footprint of the gluteus medius tendon.  Once these anchors were set we then performed a double pulley technique and interlock the anchors through the #5 FiberWire's of each respective anchor to the opposite anchors knotless mechanism.  We converted these and had excellent compression of the tendon to the bone.  The tails were then placed into a 4.75 mm swivel lock anchor and placed into the more distal aspect of the greater trochanter to complete the repair with good compression.  Wounds were copiously irrigated throughout the procedure.  The IT band and tensor fascia lotta muscle was closed with a strata fix  barbed suture in a running locking fashion.  We then closed the fat layer with 0 Vicryl.  Deep dermal tissue closed with 2-0 Vicryl, skin closed with a running subcuticular 3-0 Monocryl.  The incision and deep tissues were infiltrated with quarter percent Marcaine with epinephrine, 30 cc.  Skin was cleaned and dried and Steri-Strips  applied as well as an Aquacel bandage.  POSTOPERATIVE PLAN:  Bobby Gross will be weightbearing as tolerated with assistive device for the first 4 weeks following surgery.  Will avoid active abduction for the first 4 weeks as well.  He can begin logroll's and hip flexion extension as tolerated.  Will see him back in the office in 2 weeks.  He will be on daily aspirin for 6 weeks postoperatively for DVT prophylaxis.  Discharge home today from PACU.

## 2023-05-10 NOTE — Brief Op Note (Signed)
05/10/2023  10:10 AM  PATIENT:  Bobby Gross  67 y.o. male  PRE-OPERATIVE DIAGNOSIS:  Left hip gluteus tendon tear  POST-OPERATIVE DIAGNOSIS:  Left hip gluteus tendon tear  PROCEDURE:  Procedure(s): Left hip open gluteus medius repair (Left)  SURGEON:  Surgeons and Role:    * Yolonda Kida, MD - Primary  PHYSICIAN ASSISTANT: Dion Saucier, PA-C  ANESTHESIA:   local and general  EBL:  20 cc  BLOOD ADMINISTERED:none  DRAINS: none   LOCAL MEDICATIONS USED:  MARCAINE     SPECIMEN:  No Specimen  DISPOSITION OF SPECIMEN:  N/A  COUNTS:  YES  TOURNIQUET:  * No tourniquets in log *  DICTATION: .Note written in EPIC  PLAN OF CARE: Discharge to home after PACU  PATIENT DISPOSITION:  PACU - hemodynamically stable.   Delay start of Pharmacological VTE agent (>24hrs) due to surgical blood loss or risk of bleeding: not applicable

## 2023-05-10 NOTE — Transfer of Care (Signed)
Immediate Anesthesia Transfer of Care Note  Patient: Bobby Gross  Procedure(s) Performed: Left hip open gluteus medius repair (Left: Hip)  Patient Location: PACU  Anesthesia Type:General  Level of Consciousness: awake, alert , and patient cooperative  Airway & Oxygen Therapy: Patient Spontanous Breathing and Patient connected to face mask oxygen  Post-op Assessment: Report given to RN and Post -op Vital signs reviewed and stable  Post vital signs: Reviewed and stable  Last Vitals:  Vitals Value Taken Time  BP 148/69 05/10/23 1030  Temp    Pulse 68 05/10/23 1031  Resp 13 05/10/23 1031  SpO2 100 % 05/10/23 1031  Vitals shown include unfiled device data.  Last Pain:  Vitals:   05/10/23 0752  TempSrc: Oral  PainSc:          Complications: No notable events documented.

## 2023-05-10 NOTE — Anesthesia Postprocedure Evaluation (Signed)
Anesthesia Post Note  Patient: Bobby Gross  Procedure(s) Performed: Left hip open gluteus medius repair (Left: Hip)     Patient location during evaluation: PACU Anesthesia Type: General Level of consciousness: awake and alert Pain management: pain level controlled Vital Signs Assessment: post-procedure vital signs reviewed and stable Respiratory status: spontaneous breathing, nonlabored ventilation, respiratory function stable and patient connected to nasal cannula oxygen Cardiovascular status: blood pressure returned to baseline and stable Postop Assessment: no apparent nausea or vomiting Anesthetic complications: no   No notable events documented.  Last Vitals:  Vitals:   05/10/23 1115 05/10/23 1134  BP: (!) 149/80 (!) 158/79  Pulse: 62   Resp: 13   Temp: (!) 36.4 C   SpO2: 98%     Last Pain:  Vitals:   05/10/23 1115  TempSrc:   PainSc: 4                  [place]

## 2023-05-10 NOTE — Discharge Instructions (Signed)
Orthopedic surgery discharge instructions:  -Maintain postoperative bandage until your follow-up appointment.  This is waterproof and you may begin showering on postoperative day #2.  Please do not submerge underwater.  -Use crutches and/or a walker while ambulating.  You are appropriate to put full weight on the left lower extremity, but should avoid active movement of the left leg away from the body.  This is called abduction.  You should avoid abduction.  Use your crutches and/or walker for the first 1 month postoperatively.  -For mild to moderate pain use Tylenol and Advil in alternating fashion.  You should also apply ice liberally to the left thigh/hip incision for 20 to 30 minutes out of each hour that you are able.  -For breakthrough pain use oxycodone as necessary.  He should also take a stool softener such as Colace 100 mg twice per day while using the narcotic.  -For the prevention of DVT take an 81 mg aspirin once per day x 6 weeks.  -Follow-up with Dr. Aundria Rud in the office in 2 weeks for wound check.

## 2023-05-10 NOTE — H&P (Signed)
ORTHOPAEDIC H&P  REQUESTING PHYSICIAN: Yolonda Kida, MD  PCP:  Juliette Alcide, MD  Chief Complaint: Left hip pain  HPI: Bobby Gross is a 67 y.o. male who complains of left hip pain and weakness.  Here today for open gluteus medius tendon repair.  He has failed conservative treatment.  No new complaints today.  Past Medical History:  Diagnosis Date   B12 deficiency    Following small bowel resection   Cancer (HCC)    skin   Crohn's disease (HCC)    Involving small bowel   History of kidney stones    History of nephrolithiasis    Hyperlipemia    Hypothyroidism    Past Surgical History:  Procedure Laterality Date   BIOPSY  01/31/2017   Procedure: BIOPSY;  Surgeon: Corbin Ade, MD;  [facility]: [facility];  Service: Endoscopy;;  biopsy     BIOPSY  12/20/2021   Procedure: BIOPSY;  Surgeon: Corbin Ade, MD;  Location: [facility];  Service: Endoscopy;;  biopsies of anastomosis(stricture)   CERVICAL SPINE SURGERY  11/07/2021   C5/6 disc replacement   COLONOSCOPY  10/03/2006   Normal rectum/Status post right hemicolectomy with a small bowel anastomosis,  fibrotic-appearing anastomosis consistent with Crohn's, status post  biopsy, but overall, things look really good today   COLONOSCOPY  01/28/2012   Dr. Huey Romans R hemicolectomy for ileocolonic crohn's disease. disease appears inactive at this time.  all Bx's benign.   COLONOSCOPY N/A 01/31/2017   Procedure: COLONOSCOPY;  Surgeon: Corbin Ade, MD;  [facility]: [facility];  Service: Endoscopy;  Laterality: N/A;  7:30am   COLONOSCOPY WITH PROPOFOL N/A 12/20/2021   Procedure: COLONOSCOPY WITH PROPOFOL;  Surgeon: Corbin Ade, MD;  [facility]: [facility];  Service: Endoscopy;  Laterality: N/A;  11:15am   DEXA scan  09/2010   LEFT HEART CATH AND CORONARY ANGIOGRAPHY N/A 04/10/2017   Procedure: LEFT HEART CATH AND CORONARY ANGIOGRAPHY;  Surgeon: Lyn Records, MD;  Location: [facility];   Service: Cardiovascular;  Laterality: N/A;   RIGHT FOOT SURGER  2009   PLANTAR FASCITIS   RIGHT SHOULDER SURGERY     RIGHT WRIST SURGERY     SMALL INTESTINE SURGERY  1988   AT [facility]   TONSILLECTOMY     Social History   Socioeconomic History   Marital status: Married    Spouse name: Not on file   Number of children: Not on file   Years of education: Not on file   Highest education level: Not on file  Occupational History   Not on file  Tobacco Use   Smoking status: Never   Smokeless tobacco: Never   Tobacco comments:    Never smoker  Vaping Use   Vaping status: Never Used  Substance and Sexual Activity   Alcohol use: No   Drug use: No   Sexual activity: Yes  Other Topics Concern   Not on file  Social History Narrative   Not on file   Social Determinants of Health   Financial Resource Strain: Not on file  Food Insecurity: Not on file  Transportation Needs: Not on file  Physical Activity: Not on file  Stress: Not on file  Social Connections: Not on file   Family History  Problem Relation Age of Onset   Colon cancer Other    No Known Allergies Prior to Admission medications   Medication Sig Start Date End Date Taking? Authorizing Provider  cetirizine (ZYRTEC) 10 MG tablet Take 10 mg by mouth daily as needed for allergies.   Yes [provider]  cyanocobalamin (VITAMIN B12) 1000 MCG/ML injection INJECT 1 ML INTRAMUSCULARLY  EVERY 30 DAYS (DISCARD 28 DAYS  AFTER FIRST USE) 04/17/23  Yes Rourk, Gerrit Friends, MD  doxazosin (CARDURA) 2 MG tablet Take 2 mg by mouth at bedtime. (2200) 10/03/21  Yes [provider]  levothyroxine (SYNTHROID) 88 MCG tablet Take 88 mcg by mouth daily at 4 PM. 09/22/18  Yes [provider]  mesalamine (LIALDA) 1.2 g EC tablet TAKE 4 TABLETS BY MOUTH DAILY Patient taking differently: Take 1.2 g by mouth in the morning, at noon, in the evening, and at bedtime. 04/17/23  Yes Rourk, Gerrit Friends, MD  Multiple Vitamin (MULTIVITAMIN  WITH MINERALS) TABS Take 1 tablet by mouth in the morning.   Yes [provider]  Naphazoline-Pheniramine (OPCON-A) 0.027-0.315 % SOLN Place 1 drop into both eyes in the morning.   Yes [provider]  Omega-3 Fatty Acids (OMEGA-3 EPA FISH OIL PO) Take 1,000 mg by mouth daily with breakfast.   Yes [provider]  TURMERIC PO Take 2 capsules by mouth daily with supper.   Yes [provider]   No results found.  Positive ROS: All other systems have been reviewed and were otherwise negative with the exception of those mentioned in the HPI and as above.  Physical Exam: General: Alert, no acute distress Cardiovascular: No pedal edema Respiratory: No cyanosis, no use of accessory musculature GI: No organomegaly, abdomen is soft and non-tender Skin: No lesions in the area of chief complaint Neurologic: Sensation intact distally Psychiatric: Patient is competent for consent with normal mood and affect Lymphatic: No axillary or cervical lymphadenopathy  MUSCULOSKELETAL: Left lower extremity warm and well-perfused.  Neurovascular tact.  No open wounds.  Assessment: Left hip gluteus medius tendon tear  Plan: Plan proceed today with open gluteus medius tendon repair.  We again discussed the risk benefits of the procedure in detail.  Discussed the risk of bleeding, infection, damage to surrounding nerves and vessels, stiffness, failure of repairs, fracture, persistent pain and weakness, DVT risk as well as risk of anesthesia.  He is provided informed consent.  Plan for discharge home postop from PACU.    Yolonda Kida, MD Cell [phone]    05/10/2023 8:35 AM

## 2023-05-10 NOTE — Anesthesia Preprocedure Evaluation (Signed)
Anesthesia Evaluation  Patient identified by MRN, date of birth, ID band Patient awake    Reviewed: Allergy & Precautions, H&P , NPO status , Patient's Chart, lab work & pertinent test results  Airway Mallampati: II  TM Distance: >3 FB Neck ROM: Full    Dental no notable dental hx.    Pulmonary former smoker   Pulmonary exam normal breath sounds clear to auscultation       Cardiovascular negative cardio ROS Normal cardiovascular exam Rhythm:Regular Rate:Normal     Neuro/Psych  Headaches Hx of cervical spine surgery C5-C6  negative psych ROS   GI/Hepatic negative GI ROS, Neg liver ROS,,,Crohns disease   Endo/Other  Hypothyroidism    Renal/GU negative Renal ROS  negative genitourinary   Musculoskeletal  (+) Arthritis ,  Gluteus medius tear   Abdominal   Peds negative pediatric ROS (+)  Hematology negative hematology ROS (+)   Anesthesia Other Findings   Reproductive/Obstetrics negative OB ROS                              Anesthesia Physical Anesthesia Plan  ASA: 3  Anesthesia Plan: General   Post-op Pain Management:    Induction: Intravenous  PONV Risk Score and Plan: Ondansetron and Dexamethasone  Airway Management Planned: Oral ETT  Additional Equipment:   Intra-op Plan:   Post-operative Plan: Extubation in OR  Informed Consent: I have reviewed the patients History and Physical, chart, labs and discussed the procedure including the risks, benefits and alternatives for the proposed anesthesia with the patient or authorized representative who has indicated his/her understanding and acceptance.     Dental advisory given  Plan Discussed with: CRNA  Anesthesia Plan Comments:          Anesthesia Quick Evaluation

## 2023-05-13 ENCOUNTER — Encounter (HOSPITAL_COMMUNITY): Payer: Self-pay | Admitting: Orthopedic Surgery

## 2023-05-24 DIAGNOSIS — M25552 Pain in left hip: Secondary | ICD-10-CM | POA: Diagnosis not present

## 2023-05-29 DIAGNOSIS — M25552 Pain in left hip: Secondary | ICD-10-CM | POA: Diagnosis not present

## 2023-06-05 DIAGNOSIS — M25552 Pain in left hip: Secondary | ICD-10-CM | POA: Diagnosis not present

## 2023-06-11 DIAGNOSIS — Z0001 Encounter for general adult medical examination with abnormal findings: Secondary | ICD-10-CM | POA: Diagnosis not present

## 2023-06-11 DIAGNOSIS — R7303 Prediabetes: Secondary | ICD-10-CM | POA: Diagnosis not present

## 2023-06-11 DIAGNOSIS — R944 Abnormal results of kidney function studies: Secondary | ICD-10-CM | POA: Diagnosis not present

## 2023-06-11 DIAGNOSIS — E559 Vitamin D deficiency, unspecified: Secondary | ICD-10-CM | POA: Diagnosis not present

## 2023-06-11 DIAGNOSIS — E039 Hypothyroidism, unspecified: Secondary | ICD-10-CM | POA: Diagnosis not present

## 2023-06-11 DIAGNOSIS — E7849 Other hyperlipidemia: Secondary | ICD-10-CM | POA: Diagnosis not present

## 2023-06-12 DIAGNOSIS — M25552 Pain in left hip: Secondary | ICD-10-CM | POA: Diagnosis not present

## 2023-06-18 DIAGNOSIS — R944 Abnormal results of kidney function studies: Secondary | ICD-10-CM | POA: Diagnosis not present

## 2023-06-18 DIAGNOSIS — Z0001 Encounter for general adult medical examination with abnormal findings: Secondary | ICD-10-CM | POA: Diagnosis not present

## 2023-06-18 DIAGNOSIS — Z23 Encounter for immunization: Secondary | ICD-10-CM | POA: Diagnosis not present

## 2023-06-18 DIAGNOSIS — Z1389 Encounter for screening for other disorder: Secondary | ICD-10-CM | POA: Diagnosis not present

## 2023-06-18 DIAGNOSIS — K5090 Crohn's disease, unspecified, without complications: Secondary | ICD-10-CM | POA: Diagnosis not present

## 2023-06-18 DIAGNOSIS — I77810 Thoracic aortic ectasia: Secondary | ICD-10-CM | POA: Diagnosis not present

## 2023-06-18 DIAGNOSIS — M4802 Spinal stenosis, cervical region: Secondary | ICD-10-CM | POA: Diagnosis not present

## 2023-06-18 DIAGNOSIS — E7849 Other hyperlipidemia: Secondary | ICD-10-CM | POA: Diagnosis not present

## 2023-06-18 DIAGNOSIS — R7303 Prediabetes: Secondary | ICD-10-CM | POA: Diagnosis not present

## 2023-06-19 DIAGNOSIS — M25552 Pain in left hip: Secondary | ICD-10-CM | POA: Diagnosis not present

## 2023-06-25 DIAGNOSIS — M25552 Pain in left hip: Secondary | ICD-10-CM | POA: Diagnosis not present

## 2023-07-01 DIAGNOSIS — I781 Nevus, non-neoplastic: Secondary | ICD-10-CM | POA: Diagnosis not present

## 2023-07-01 DIAGNOSIS — L570 Actinic keratosis: Secondary | ICD-10-CM | POA: Diagnosis not present

## 2023-07-02 DIAGNOSIS — I700 Atherosclerosis of aorta: Secondary | ICD-10-CM | POA: Diagnosis not present

## 2023-07-02 DIAGNOSIS — J9811 Atelectasis: Secondary | ICD-10-CM | POA: Diagnosis not present

## 2023-07-02 DIAGNOSIS — I7121 Aneurysm of the ascending aorta, without rupture: Secondary | ICD-10-CM | POA: Diagnosis not present

## 2023-07-02 DIAGNOSIS — Z09 Encounter for follow-up examination after completed treatment for conditions other than malignant neoplasm: Secondary | ICD-10-CM | POA: Diagnosis not present

## 2023-07-02 DIAGNOSIS — I77819 Aortic ectasia, unspecified site: Secondary | ICD-10-CM | POA: Diagnosis not present

## 2023-07-03 DIAGNOSIS — M25552 Pain in left hip: Secondary | ICD-10-CM | POA: Diagnosis not present

## 2023-07-10 DIAGNOSIS — M25552 Pain in left hip: Secondary | ICD-10-CM | POA: Diagnosis not present

## 2023-07-17 DIAGNOSIS — M25552 Pain in left hip: Secondary | ICD-10-CM | POA: Diagnosis not present

## 2023-07-19 ENCOUNTER — Encounter: Payer: Self-pay | Admitting: Internal Medicine

## 2023-07-25 DIAGNOSIS — M25552 Pain in left hip: Secondary | ICD-10-CM | POA: Diagnosis not present

## 2023-08-14 DIAGNOSIS — M25552 Pain in left hip: Secondary | ICD-10-CM | POA: Diagnosis not present

## 2023-08-23 DIAGNOSIS — M25552 Pain in left hip: Secondary | ICD-10-CM | POA: Diagnosis not present

## 2023-08-28 DIAGNOSIS — M25552 Pain in left hip: Secondary | ICD-10-CM | POA: Diagnosis not present

## 2023-09-10 DIAGNOSIS — M25552 Pain in left hip: Secondary | ICD-10-CM | POA: Diagnosis not present

## 2023-09-18 DIAGNOSIS — M25552 Pain in left hip: Secondary | ICD-10-CM | POA: Diagnosis not present

## 2023-09-19 DIAGNOSIS — M25552 Pain in left hip: Secondary | ICD-10-CM | POA: Diagnosis not present

## 2023-09-25 DIAGNOSIS — H43812 Vitreous degeneration, left eye: Secondary | ICD-10-CM | POA: Diagnosis not present

## 2023-09-25 DIAGNOSIS — D3131 Benign neoplasm of right choroid: Secondary | ICD-10-CM | POA: Diagnosis not present

## 2023-09-25 DIAGNOSIS — H2513 Age-related nuclear cataract, bilateral: Secondary | ICD-10-CM | POA: Diagnosis not present

## 2023-09-25 DIAGNOSIS — Z48810 Encounter for surgical aftercare following surgery on the sense organs: Secondary | ICD-10-CM | POA: Diagnosis not present

## 2023-10-23 DIAGNOSIS — N138 Other obstructive and reflux uropathy: Secondary | ICD-10-CM | POA: Diagnosis not present

## 2023-10-23 DIAGNOSIS — E039 Hypothyroidism, unspecified: Secondary | ICD-10-CM | POA: Diagnosis not present

## 2023-10-23 DIAGNOSIS — E782 Mixed hyperlipidemia: Secondary | ICD-10-CM | POA: Diagnosis not present

## 2023-10-23 DIAGNOSIS — R5383 Other fatigue: Secondary | ICD-10-CM | POA: Diagnosis not present

## 2023-10-23 DIAGNOSIS — R7303 Prediabetes: Secondary | ICD-10-CM | POA: Diagnosis not present

## 2023-10-23 DIAGNOSIS — R011 Cardiac murmur, unspecified: Secondary | ICD-10-CM | POA: Diagnosis not present

## 2023-10-23 DIAGNOSIS — E7849 Other hyperlipidemia: Secondary | ICD-10-CM | POA: Diagnosis not present

## 2023-10-30 DIAGNOSIS — R3912 Poor urinary stream: Secondary | ICD-10-CM | POA: Diagnosis not present

## 2023-11-19 ENCOUNTER — Ambulatory Visit: Admitting: Internal Medicine

## 2023-11-19 ENCOUNTER — Encounter: Payer: Self-pay | Admitting: Internal Medicine

## 2023-11-19 VITALS — BP 135/76 | HR 69 | Temp 97.8°F | Ht 72.0 in | Wt 176.4 lb

## 2023-11-19 DIAGNOSIS — R748 Abnormal levels of other serum enzymes: Secondary | ICD-10-CM

## 2023-11-19 DIAGNOSIS — K5080 Crohn's disease of both small and large intestine without complications: Secondary | ICD-10-CM

## 2023-11-19 DIAGNOSIS — K5010 Crohn's disease of large intestine without complications: Secondary | ICD-10-CM

## 2023-11-19 NOTE — Patient Instructions (Signed)
 Good to see you again today!  Hopefully, you could get by with taking a 200 mg ibuprofen tablet no more than once daily and only as needed for musculoskeletal pain.  May supplement with Tylenol  at other times  For now continue Lialda  4.8 g daily.  As discussed,  this is off label treatment for Crohn's disease.  It is possible that this medication may not doing that much to combat inflammation in your small bowel.  However, for now we will continue this regimen.  I do not recommend stepping up treatment to a injection biologic agent at this time.  Recommendations could change in the future.  C-Met, CBC and CRP-we will coordinate with Dr. Verlene Glimpse office next month to make sure when he draws blood these labs are obtained.  Unless something comes up, plan to see back in 6 months.

## 2023-11-19 NOTE — Progress Notes (Unsigned)
 Primary Care Physician:  Alston Jerry, MD Primary Gastroenterologist:  Dr. Riley Cheadle  Pre-Procedure History & Physical: HPI:  Bobby Gross is a 68 y.o. male here for with well-established ileocolonic Crohn's disease status post ileocolonic resection with surgical remission many years ago.  He has ended up staying on off label remittive therapy on the way of [place]  and more recently Lialda  4.8 g daily.  He really has no bowel symptoms these days he has 3-4 formed bowel movements daily no abdominal pain nausea or vomiting recent colonoscopy demonstrated 7mm aperture at the anastomosis biopsies negative for inflammation.  He has had significant orthopedic injuries with a hamstring rupture on the right and gluteal tendon tear on the left with bursitis requiring surgery.  He is in a lot of residual pain takes Tylenol  only.  He ask about taking ibuprofen.  Past Medical History:  Diagnosis Date   B12 deficiency    Following small bowel resection   Cancer (HCC)    skin   Crohn's disease (HCC)    Involving small bowel   History of kidney stones    History of nephrolithiasis    Hyperlipemia    Hypothyroidism     Past Surgical History:  Procedure Laterality Date   BIOPSY  01/31/2017   Procedure: BIOPSY;  Surgeon: Suzette Espy, MD;  [facility];  Service: Endoscopy;;  biopsy     BIOPSY  12/20/2021   Procedure: BIOPSY;  Surgeon: Suzette Espy, MD;  [facility];  Service: Endoscopy;;  biopsies of anastomosis(stricture)   CERVICAL SPINE SURGERY  11/07/2021   C5/6 disc replacement   COLONOSCOPY  10/03/2006   Normal rectum/Status post right hemicolectomy with a small bowel anastomosis,  fibrotic-appearing anastomosis consistent with Crohn's, status post  biopsy, but overall, things look really good today   COLONOSCOPY  01/28/2012   Dr. Elma Hacker R hemicolectomy for ileocolonic crohn's disease. disease appears inactive at this time.  all Bx's benign.    COLONOSCOPY N/A 01/31/2017   Procedure: COLONOSCOPY;  Surgeon: Suzette Espy, MD;  [facility];  Service: Endoscopy;  Laterality: N/A;  7:30am   COLONOSCOPY WITH PROPOFOL  N/A 12/20/2021   Procedure: COLONOSCOPY WITH PROPOFOL ;  Surgeon: Suzette Espy, MD;  [facility];  Service: Endoscopy;  Laterality: N/A;  11:15am   DEXA scan  09/2010   LEFT HEART CATH AND CORONARY ANGIOGRAPHY N/A 04/10/2017   Procedure: LEFT HEART CATH AND CORONARY ANGIOGRAPHY;  Surgeon: Arty Binning, MD;  [facility]: [facility];  Service: Cardiovascular;  Laterality: N/A;   OPEN SURGICAL REPAIR OF GLUTEAL TENDON Left 05/10/2023   Procedure: Left hip open gluteus medius repair;  Surgeon: Janeth Medicus, MD;  Location: [facility];  Service: Orthopedics;  Laterality: Left;   RIGHT FOOT SURGER  2009   PLANTAR FASCITIS   RIGHT SHOULDER SURGERY     RIGHT WRIST SURGERY     SMALL INTESTINE SURGERY  1988   AT [facility]   TONSILLECTOMY      Prior to Admission medications   Medication Sig Start Date End Date Taking? Authorizing Provider  cetirizine (ZYRTEC) 10 MG tablet Take 10 mg by mouth daily as needed for allergies.   Yes [provider]  cyanocobalamin  (VITAMIN B12) 1000 MCG/ML injection INJECT 1 ML INTRAMUSCULARLY  EVERY 30 DAYS (DISCARD 28 DAYS  AFTER FIRST USE) 04/17/23  Yes Helena Sardo, Windsor Hatcher, MD  doxazosin (CARDURA) 4 MG tablet Take 4 mg by mouth  daily. 09/11/23  Yes [provider]  levothyroxine (SYNTHROID) 88 MCG tablet Take 88 mcg by mouth daily at 4 PM. 09/22/18  Yes [provider]  mesalamine  (LIALDA ) 1.2 g EC tablet TAKE 4 TABLETS BY MOUTH DAILY Patient taking differently: Take 1.2 g by mouth in the morning, at noon, in the evening, and at bedtime. 04/17/23  Yes Cole Eastridge, Windsor Hatcher, MD  Multiple Vitamin (MULTIVITAMIN WITH MINERALS) TABS Take 1 tablet by mouth in the morning.   Yes [provider]  Naphazoline-Pheniramine (OPCON-A) 0.027-0.315 % SOLN Place  1 drop into both eyes in the morning.   Yes [provider]  Omega-3 Fatty Acids (OMEGA-3 EPA FISH OIL PO) Take 1,000 mg by mouth daily with breakfast.   Yes [provider]  TURMERIC PO Take 2 capsules by mouth daily with supper.   Yes [provider]    Allergies as of 11/19/2023   (No Known Allergies)    Family History  Problem Relation Age of Onset   Colon cancer Other     Social History   Socioeconomic History   Marital status: Married    Spouse name: Not on file   Number of children: Not on file   Years of education: Not on file   Highest education level: Not on file  Occupational History   Not on file  Tobacco Use   Smoking status: Never   Smokeless tobacco: Never   Tobacco comments:    Never smoker  Vaping Use   Vaping status: Never Used  Substance and Sexual Activity   Alcohol  use: No   Drug use: No   Sexual activity: Yes  Other Topics Concern   Not on file  Social History Narrative   Not on file   Social Drivers of Health   Financial Resource Strain: Not on file  Food Insecurity: Not on file  Transportation Needs: Not on file  Physical Activity: Not on file  Stress: Not on file  Social Connections: Not on file  Intimate Partner Violence: Not on file    Review of Systems: See HPI, otherwise negative ROS  Physical Exam: BP 135/76 (BP Location: Right Arm, Patient Position: Sitting, Cuff Size: Normal)   Pulse 69   Temp 97.8 F (36.6 C) (Oral)   Ht 6' (1.829 m)   Wt 176 lb 6.4 oz (80 kg)   SpO2 98%   BMI 23.92 kg/m  General:   Alert,  Well-developed, well-nourished, pleasant and cooperative in NAD Neck:  Supple; no masses or thyromegaly. No significant cervical adenopathy. Lungs:  Clear throughout to auscultation.   No wheezes, crackles, or rhonchi. No acute distress. Heart:  Regular rate and rhythm; no murmurs, clicks, rubs,  or gallops. Abdomen: Non-distended, normal bowel sounds.  Healed surgical scar.  Soft and  nontender without appreciable mass or hepatosplenomegaly.  Pulses:  Normal pulses noted. Extremities:  Without clubbing or edema.  12 centimeter left thigh scar healing well.  Impression/Plan: 68 year old gentleman with longstanding ileocolonic Crohn's disease.  I feel he attained a surgical remission years ago.  He does have a stenotic anastomosis but clinically doing well and appears to have inactive disease.  I am not sure that any pharmacologic therapy at this point would make much difference.  He is ended up on long-term off label mesalamine  which may not be any more than homeopathic therapy at this time.  He is at multiple orthopedic issues recently.  Very much like to try some ibuprofen.  We discussed this  previously.  Although this could exacerbate inflammatory bowel disease if he uses ibuprofen judiciously he may find efficacy without exacerbating IBD.  Recommendations:   Try digestive issues of ibuprofen i.e. 200 mg tablet no more than once daily and only as needed for musculoskeletal pain.  May supplement with Tylenol  at other times  For now continue Lialda  4.8 g daily.  As discussed, this is off label treatment for Crohn's disease.  Fact, it may be superfluous at this time. I do not recommend stepping up treatment to a injection biologic agent at this time.  Recommendations could change in the future.  C-Met, CBC and CRP-we will coordinate with Dr. Verlene Glimpse office next month to make sure when he draws blood these labs are obtained.  Unless something comes up, plan to see back in 6 months.  Will consider pulling off oral mesalamine  within the next year.     Notice: This dictation was prepared with Dragon dictation along with smaller phrase technology. Any transcriptional errors that result from this process are unintentional and may not be corrected upon review.

## 2023-11-27 DIAGNOSIS — H43812 Vitreous degeneration, left eye: Secondary | ICD-10-CM | POA: Diagnosis not present

## 2023-11-27 DIAGNOSIS — H2513 Age-related nuclear cataract, bilateral: Secondary | ICD-10-CM | POA: Diagnosis not present

## 2023-11-27 DIAGNOSIS — Z48810 Encounter for surgical aftercare following surgery on the sense organs: Secondary | ICD-10-CM | POA: Diagnosis not present

## 2023-12-04 DIAGNOSIS — E782 Mixed hyperlipidemia: Secondary | ICD-10-CM | POA: Diagnosis not present

## 2023-12-04 DIAGNOSIS — R7303 Prediabetes: Secondary | ICD-10-CM | POA: Diagnosis not present

## 2023-12-04 DIAGNOSIS — K5010 Crohn's disease of large intestine without complications: Secondary | ICD-10-CM | POA: Diagnosis not present

## 2023-12-04 DIAGNOSIS — E039 Hypothyroidism, unspecified: Secondary | ICD-10-CM | POA: Diagnosis not present

## 2023-12-04 DIAGNOSIS — R944 Abnormal results of kidney function studies: Secondary | ICD-10-CM | POA: Diagnosis not present

## 2023-12-04 DIAGNOSIS — R748 Abnormal levels of other serum enzymes: Secondary | ICD-10-CM | POA: Diagnosis not present

## 2023-12-11 ENCOUNTER — Encounter: Payer: Self-pay | Admitting: Family Medicine

## 2023-12-11 ENCOUNTER — Other Ambulatory Visit: Payer: Self-pay | Admitting: Family Medicine

## 2023-12-11 DIAGNOSIS — M79651 Pain in right thigh: Secondary | ICD-10-CM

## 2023-12-11 DIAGNOSIS — S76311A Strain of muscle, fascia and tendon of the posterior muscle group at thigh level, right thigh, initial encounter: Secondary | ICD-10-CM | POA: Diagnosis not present

## 2023-12-11 DIAGNOSIS — S73192A Other sprain of left hip, initial encounter: Secondary | ICD-10-CM | POA: Diagnosis not present

## 2023-12-11 DIAGNOSIS — E039 Hypothyroidism, unspecified: Secondary | ICD-10-CM | POA: Diagnosis not present

## 2023-12-11 DIAGNOSIS — Z6823 Body mass index (BMI) 23.0-23.9, adult: Secondary | ICD-10-CM | POA: Diagnosis not present

## 2023-12-19 ENCOUNTER — Ambulatory Visit
Admission: RE | Admit: 2023-12-19 | Discharge: 2023-12-19 | Disposition: A | Discharge status: Home / Self Care | Source: Ambulatory Visit | Attending: Family Medicine | Admitting: Family Medicine

## 2023-12-19 DIAGNOSIS — M79651 Pain in right thigh: Secondary | ICD-10-CM | POA: Diagnosis not present

## 2023-12-30 DIAGNOSIS — L570 Actinic keratosis: Secondary | ICD-10-CM | POA: Diagnosis not present

## 2023-12-30 DIAGNOSIS — I781 Nevus, non-neoplastic: Secondary | ICD-10-CM | POA: Diagnosis not present

## 2024-01-22 DIAGNOSIS — H43812 Vitreous degeneration, left eye: Secondary | ICD-10-CM | POA: Diagnosis not present

## 2024-01-22 DIAGNOSIS — H2513 Age-related nuclear cataract, bilateral: Secondary | ICD-10-CM | POA: Diagnosis not present

## 2024-01-24 ENCOUNTER — Other Ambulatory Visit: Payer: Self-pay | Admitting: Internal Medicine

## 2024-01-26 ENCOUNTER — Other Ambulatory Visit: Payer: Self-pay | Admitting: Internal Medicine

## 2024-02-06 NOTE — Progress Notes (Shared)
 [facility] - Clinic Note  02/19/2024   CHIEF COMPLAINT Patient presents for No chief complaint on file.  HISTORY OF PRESENT ILLNESS: Bobby Gross is a 68 y.o. male who presents to the clinic today for:   Referring physician: Lari Elspeth BRAVO, MD [address] [place],  [place] [address]  HISTORICAL INFORMATION:  Selected notes from the MEDICAL RECORD NUMBER Referred by Dr. JAMA:  Ocular Hx- PMH-   CURRENT MEDICATIONS: Current Outpatient Medications (Ophthalmic Drugs)  Medication Sig   Naphazoline-Pheniramine (OPCON-A) 0.027-0.315 % SOLN Place 1 drop into both eyes in the morning.   No current facility-administered medications for this visit. (Ophthalmic Drugs)   Current Outpatient Medications (Other)  Medication Sig   cetirizine (ZYRTEC) 10 MG tablet Take 10 mg by mouth daily as needed for allergies.   cyanocobalamin  (VITAMIN B12) 1000 MCG/ML injection INJECT 1 ML INTRAMUSCULARLY  EVERY 30 DAYS (DISCARD 28 DAYS  AFTER FIRST USE)   doxazosin (CARDURA) 4 MG tablet Take 4 mg by mouth daily.   levothyroxine (SYNTHROID) 88 MCG tablet Take 88 mcg by mouth daily at 4 PM.   mesalamine  (LIALDA ) 1.2 g EC tablet TAKE 4 TABLETS BY MOUTH DAILY   Multiple Vitamin (MULTIVITAMIN WITH MINERALS) TABS Take 1 tablet by mouth in the morning.   Omega-3 Fatty Acids (OMEGA-3 EPA FISH OIL PO) Take 1,000 mg by mouth daily with breakfast.   TURMERIC PO Take 2 capsules by mouth daily with supper.   No current facility-administered medications for this visit. (Other)   REVIEW OF SYSTEMS:  ALLERGIES No Known Allergies PAST MEDICAL HISTORY Past Medical History:  Diagnosis Date   B12 deficiency    Following small bowel resection   Cancer (HCC)    skin   Crohn's disease (HCC)    Involving small bowel   History of kidney stones    History of nephrolithiasis    Hyperlipemia    Hypothyroidism    Past Surgical History:  Procedure Laterality Date   BIOPSY  01/31/2017    Procedure: BIOPSY;  Surgeon: Shaaron Lamar HERO, MD;  [facility];  Service: Endoscopy;;  biopsy     BIOPSY  12/20/2021   Procedure: BIOPSY;  Surgeon: Shaaron Lamar HERO, MD;  [facility];  Service: Endoscopy;;  biopsies of anastomosis(stricture)   CERVICAL SPINE SURGERY  11/07/2021   C5/6 disc replacement   COLONOSCOPY  10/03/2006   Normal rectum/Status post right hemicolectomy with a small bowel anastomosis,  fibrotic-appearing anastomosis consistent with Crohn's, status post  biopsy, but overall, things look really good today   COLONOSCOPY  01/28/2012   Dr. Waddell R hemicolectomy for ileocolonic crohn's disease. disease appears inactive at this time.  all Bx's benign.   COLONOSCOPY N/A 01/31/2017   Procedure: COLONOSCOPY;  Surgeon: Shaaron Lamar HERO, MD;  [facility];  Service: Endoscopy;  Laterality: N/A;  7:30am   COLONOSCOPY WITH PROPOFOL  N/A 12/20/2021   Procedure: COLONOSCOPY WITH PROPOFOL ;  Surgeon: Shaaron Lamar HERO, MD;  [facility];  Service: Endoscopy;  Laterality: N/A;  11:15am   DEXA scan  09/2010   LEFT HEART CATH AND CORONARY ANGIOGRAPHY N/A 04/10/2017   Procedure: LEFT HEART CATH AND CORONARY ANGIOGRAPHY;  Surgeon: Claudene Victory ORN, MD;  [facility];  Service: Cardiovascular;  Laterality: N/A;   OPEN SURGICAL REPAIR OF GLUTEAL TENDON Left 05/10/2023   Procedure: Left hip open gluteus medius repair;  Surgeon: Sharl Selinda Dover, MD;  Location:  [facility];  Service: Orthopedics;  Laterality: Left;   RIGHT FOOT SURGER  2009   PLANTAR FASCITIS   RIGHT SHOULDER SURGERY     RIGHT WRIST SURGERY     SMALL INTESTINE SURGERY  1988   AT [facility]   TONSILLECTOMY     FAMILY HISTORY Family History  Problem Relation Age of Onset   Colon cancer Other    SOCIAL HISTORY Social History   Tobacco Use   Smoking status: Never   Smokeless tobacco: Never   Tobacco comments:    Never smoker  Vaping Use   Vaping status: Never Used   Substance Use Topics   Alcohol  use: No   Drug use: No       OPHTHALMIC EXAM:  Not recorded    IMAGING AND PROCEDURES  Imaging and Procedures for 02/19/2024        ASSESSMENT/PLAN: No diagnosis found. 1.  2.  3.  Ophthalmic Meds Ordered this visit:  No orders of the defined types were placed in this encounter.    No follow-ups on file.  There are no Patient Instructions on file for this visit.  Explained the diagnoses, plan, and follow up with the patient and they expressed understanding.  Patient expressed understanding of the importance of proper follow up care.   Bobby Gross, M.D., Ph.D. Diseases & Surgery of the Retina and Vitreous [facility] 02/19/2024  Abbreviations: M myopia (nearsighted); A astigmatism; H hyperopia (farsighted); P presbyopia; Mrx spectacle prescription;  CTL contact lenses; OD right eye; OS left eye; OU both eyes  XT exotropia; ET esotropia; PEK punctate epithelial keratitis; PEE punctate epithelial erosions; DES dry eye syndrome; MGD meibomian gland dysfunction; ATs artificial tears; PFAT's preservative free artificial tears; NSC nuclear sclerotic cataract; PSC posterior subcapsular cataract; ERM epi-retinal membrane; PVD posterior vitreous detachment; RD retinal detachment; DM diabetes mellitus; DR diabetic retinopathy; NPDR non-proliferative diabetic retinopathy; PDR proliferative diabetic retinopathy; CSME clinically significant macular edema; DME diabetic macular edema; dbh dot blot hemorrhages; CWS cotton wool spot; POAG primary open angle glaucoma; C/D cup-to-disc ratio; HVF humphrey visual field; GVF goldmann visual field; OCT optical coherence tomography; IOP intraocular pressure; BRVO Branch retinal vein occlusion; CRVO central retinal vein occlusion; CRAO central retinal artery occlusion; BRAO branch retinal artery occlusion; RT retinal tear; SB scleral buckle; PPV pars plana vitrectomy; VH Vitreous hemorrhage;  PRP panretinal laser photocoagulation; IVK intravitreal kenalog; VMT vitreomacular traction; MH Macular hole;  NVD neovascularization of the disc; NVE neovascularization elsewhere; AREDS age related eye disease study; ARMD age related macular degeneration; POAG primary open angle glaucoma; EBMD epithelial/anterior basement membrane dystrophy; ACIOL anterior chamber intraocular lens; IOL intraocular lens; PCIOL posterior chamber intraocular lens; Phaco/IOL phacoemulsification with intraocular lens placement; PRK photorefractive keratectomy; LASIK laser assisted in situ keratomileusis; HTN hypertension; DM diabetes mellitus; COPD chronic obstructive pulmonary disease

## 2024-02-10 NOTE — Progress Notes (Signed)
 [facility] - Clinic Note  02/12/2024   CHIEF COMPLAINT Patient presents for Retina Evaluation  HISTORY OF PRESENT ILLNESS: Bobby Gross is a 68 y.o. male who presents to the clinic today for:  HPI     Retina Evaluation   In left eye.  This started 5 months ago.  Duration of 5 months.  Associated Symptoms Flashes and Floaters.  Negative for Distortion, Blind Spot, Pain, Redness, Photophobia, Glare, Trauma, Scalp Tenderness, Jaw Claudication, Shoulder/Hip pain, Fever, Weight Loss and Fatigue.  Context:  distance vision, mid-range vision and near vision.  Response to treatment was no improvement.  I, the attending physician,  performed the HPI with the patient and updated documentation appropriately.        Comments   Pt states he first noticed a semi circle with dots inside off the peripheral of his left eye on 09/20/2023. He had noticed about 6 flashes before that while lying in bed before that. Pt saw [facility] on 02/26 and they told him to wait a couple of months to see if condition improves, then held off another 2 months before getting referred here in June. Pt states the only thing that has changed is that the dots turned into circles, then hour glass, then blob. Pt states vision is not as clear as it was before. Pt uses ATS qam. Pt denies pain.      Last edited by Valdemar Rogue, MD on 02/12/2024  9:20 PM.    Pt is here on the referral of Dr. Juli for concern of PVD, pt states he saw Dr. Juli 3 times for PVD OS, he started noticing small floaters in February, but now it looks like a large blob that he sees all the time   Referring physician: Juli Blunt, MD [address] [place],  [place] [address]  HISTORICAL INFORMATION:  Selected notes from the MEDICAL RECORD NUMBER Referred by Dr. Juli for PVD w/ visually significant vitreous opacities LEE:  Ocular Hx- PMH-   CURRENT MEDICATIONS: Current Outpatient Medications (Ophthalmic Drugs)   Medication Sig   Naphazoline-Pheniramine (OPCON-A) 0.027-0.315 % SOLN Place 1 drop into both eyes in the morning.   No current facility-administered medications for this visit. (Ophthalmic Drugs)   Current Outpatient Medications (Other)  Medication Sig   cetirizine (ZYRTEC) 10 MG tablet Take 10 mg by mouth daily as needed for allergies.   cyanocobalamin  (VITAMIN B12) 1000 MCG/ML injection INJECT 1 ML INTRAMUSCULARLY  EVERY 30 DAYS (DISCARD 28 DAYS  AFTER FIRST USE)   doxazosin (CARDURA) 4 MG tablet Take 4 mg by mouth daily.   levothyroxine (SYNTHROID) 100 MCG tablet Take 100 mcg by mouth daily.   mesalamine  (LIALDA ) 1.2 g EC tablet TAKE 4 TABLETS BY MOUTH DAILY   Multiple Vitamin (MULTIVITAMIN WITH MINERALS) TABS Take 1 tablet by mouth in the morning.   Omega-3 Fatty Acids (OMEGA-3 EPA FISH OIL PO) Take 1,000 mg by mouth daily with breakfast.   TURMERIC PO Take 2 capsules by mouth daily with supper.   levothyroxine (SYNTHROID) 88 MCG tablet Take 88 mcg by mouth daily at 4 PM. (Patient not taking: Reported on 02/12/2024)   No current facility-administered medications for this visit. (Other)   REVIEW OF SYSTEMS: ROS   Positive for: Gastrointestinal, Musculoskeletal, Eyes, Allergic/Imm Negative for: Constitutional, Neurological, Skin, Genitourinary, HENT, Endocrine, Cardiovascular, Respiratory, Psychiatric, Heme/Lymph Last edited by Elnor Avelina RAMAN, COT on 02/12/2024  9:03 AM.     ALLERGIES No Known Allergies PAST MEDICAL HISTORY  Past Medical History:  Diagnosis Date   B12 deficiency    Following small bowel resection   Cancer (HCC)    skin   Crohn's disease (HCC)    Involving small bowel   History of kidney stones    History of nephrolithiasis    Hyperlipemia    Hypothyroidism    Past Surgical History:  Procedure Laterality Date   BIOPSY  01/31/2017   Procedure: BIOPSY;  Surgeon: Shaaron Lamar HERO, MD;  [facility];  Service: Endoscopy;;  biopsy     BIOPSY   12/20/2021   Procedure: BIOPSY;  Surgeon: Shaaron Lamar HERO, MD;  [facility];  Service: Endoscopy;;  biopsies of anastomosis(stricture)   CERVICAL SPINE SURGERY  11/07/2021   C5/6 disc replacement   COLONOSCOPY  10/03/2006   Normal rectum/Status post right hemicolectomy with a small bowel anastomosis,  fibrotic-appearing anastomosis consistent with Crohn's, status post  biopsy, but overall, things look really good today   COLONOSCOPY  01/28/2012   Dr. Waddell R hemicolectomy for ileocolonic crohn's disease. disease appears inactive at this time.  all Bx's benign.   COLONOSCOPY N/A 01/31/2017   Procedure: COLONOSCOPY;  Surgeon: Shaaron Lamar HERO, MD;  [facility];  Service: Endoscopy;  Laterality: N/A;  7:30am   COLONOSCOPY WITH PROPOFOL  N/A 12/20/2021   Procedure: COLONOSCOPY WITH PROPOFOL ;  Surgeon: Shaaron Lamar HERO, MD;  [facility];  Service: Endoscopy;  Laterality: N/A;  11:15am   DEXA scan  09/2010   LEFT HEART CATH AND CORONARY ANGIOGRAPHY N/A 04/10/2017   Procedure: LEFT HEART CATH AND CORONARY ANGIOGRAPHY;  Surgeon: Claudene Victory ORN, MD;  [facility];  Service: Cardiovascular;  Laterality: N/A;   OPEN SURGICAL REPAIR OF GLUTEAL TENDON Left 05/10/2023   Procedure: Left hip open gluteus medius repair;  Surgeon: Sharl Selinda Dover, MD;  Location: [facility];  Service: Orthopedics;  Laterality: Left;   RIGHT FOOT SURGER  2009   PLANTAR FASCITIS   RIGHT SHOULDER SURGERY     RIGHT WRIST SURGERY     SMALL INTESTINE SURGERY  1988   AT [facility]   TONSILLECTOMY     FAMILY HISTORY Family History  Problem Relation Age of Onset   Colon cancer Other    SOCIAL HISTORY Social History   Tobacco Use   Smoking status: Never   Smokeless tobacco: Never   Tobacco comments:    Never smoker  Vaping Use   Vaping status: Never Used  Substance Use Topics   Alcohol  use: No   Drug use: No       OPHTHALMIC EXAM:  Base Eye Exam     Visual Acuity  (Snellen - Linear)       Right Left   Dist cc 20/20 -1 20/25 +2   Dist ph cc  NI    Correction: Glasses         Tonometry (Tonopen, 9:09 AM)       Right Left   Pressure 17 17         Pupils       Pupils Dark Light Shape React APD   Right PERRL 3 2 Round Brisk None   Left PERRL 3 2 Round Brisk None         Visual Fields       Left Right    Full Full         Extraocular Movement       Right Left    Full,  Ortho Full, Ortho         Neuro/Psych     Oriented x3: Yes         Dilation     Both eyes: 1.0% Mydriacyl, 2.5% Phenylephrine @ 9:10 AM           Slit Lamp and Fundus Exam     Slit Lamp Exam       Right Left   Lids/Lashes Dermatochalasis - upper lid, Meibomian gland dysfunction Dermatochalasis - upper lid, Meibomian gland dysfunction   Conjunctiva/Sclera White and quiet Mild nasal and temporal pinguecula   Cornea 4 cut RK, mild arcus, nasal and temporal LRI Clear   Anterior Chamber deep and clear deep and clear   Iris Round and dilated Round and dilated   Lens 2+ Nuclear sclerosis, 2+ Cortical cataract 2+ Nuclear sclerosis, 2+ Cortical cataract   Anterior Vitreous mild syneresis mild syneresis, Posterior vitreous detachment with prominent, fibrotic weiss ring         Fundus Exam       Right Left   Disc Pink and Sharp Pink and Sharp   C/D Ratio 0.55 0.3   Macula Flat, Good foveal reflex, RPE mottling, No heme or edema Flat, Good foveal reflex, Drusen, RPE mottling, No heme or edema   Vessels mild attenuation, mild tortuosity attenuated, mild tortuosity   Periphery Attached, reticular degeneration, No heme Attached; no RT/RD           Refraction     Wearing Rx       Sphere Cylinder Axis   Right +0.75 +0.50 035   Left -1.50 +2.25 002    Age: 14-3 yoa   Type: SVL           IMAGING AND PROCEDURES  Imaging and Procedures for 02/12/2024  OCT, Retina - OU - Both Eyes       Right Eye Quality was good. Central Foveal  Thickness: 297. Progression has no prior data. Findings include normal foveal contour, no IRF, no SRF, retinal drusen (Partial PVD).   Left Eye Quality was good. Central Foveal Thickness: 307. Progression has no prior data. Findings include normal foveal contour, no IRF, no SRF, retinal drusen .   Notes *Images captured and stored on drive  Diagnosis / Impression:  NFP, no IRF/SRF OU  Clinical management:  See below  Abbreviations: NFP - Normal foveal profile. CME - cystoid macular edema. PED - pigment epithelial detachment. IRF - intraretinal fluid. SRF - subretinal fluid. EZ - ellipsoid zone. ERM - epiretinal membrane. ORA - outer retinal atrophy. ORT - outer retinal tubulation. SRHM - subretinal hyper-reflective material. IRHM - intraretinal hyper-reflective material           ASSESSMENT/PLAN:   ICD-10-CM   1. Posterior vitreous detachment of left eye  H43.812 OCT, Retina - OU - Both Eyes    2. Combined forms of age-related cataract of both eyes  H25.813      PVD OS  - onset in February 2025 -- persistent prominent symptomatic floater x5 mos without significant improvement  - pt reports significant impact on activities of daily living  - Discussed findings, prognosis, and treatment options  - No RT or RD on 360 scleral depressed exam  - Reviewed s/s of RT/RD  - Strict return precautions for any such RT/RD signs/symptoms  - discussed surgery for visually significant vitreous opacities -- risks ,benefits, and alternatives to surgery  - recommend continued monitoring for now  - f/u in 6 wks -- DFE/OCT,  possible surgical planning  2. Mixed Cataract OU - The symptoms of cataract, surgical options, and treatments and risks were discussed with patient. - discussed diagnosis and progression - monitor  Ophthalmic Meds Ordered this visit:  No orders of the defined types were placed in this encounter.    Return in about 6 weeks (around 03/25/2024) for f/u PVD OS, DFE,  OCT.  There are no Patient Instructions on file for this visit.  Explained the diagnoses, plan, and follow up with the patient and they expressed understanding.  Patient expressed understanding of the importance of proper follow up care.  This document serves as a record of services personally performed by [name] Hans, MD, PhD. It was created on their behalf by Almetta Pesa, an ophthalmic technician. The creation of this record is the provider's dictation and/or activities during the visit.    Electronically signed by: Almetta Pesa, OA, 02/12/24  9:21 PM  This document serves as a record of services personally performed by [name] Hans, MD, PhD. It was created on their behalf by Alan PARAS. Delores, OA an ophthalmic technician. The creation of this record is the provider's dictation and/or activities during the visit.    Electronically signed by: Alan PARAS. Delores, OA 02/12/24 9:21 PM  [name] Hans, M.D., Ph.D. Diseases & Surgery of the Retina and Vitreous [facility] 02/12/2024  I have reviewed the above documentation for accuracy and completeness, and I agree with the above. [name] Hans, M.D., Ph.D. 02/12/24 9:26 PM   Abbreviations: M myopia (nearsighted); A astigmatism; H hyperopia (farsighted); P presbyopia; Mrx spectacle prescription;  CTL contact lenses; OD right eye; OS left eye; OU both eyes  XT exotropia; ET esotropia; PEK punctate epithelial keratitis; PEE punctate epithelial erosions; DES dry eye syndrome; MGD meibomian gland dysfunction; ATs artificial tears; PFAT's preservative free artificial tears; NSC nuclear sclerotic cataract; PSC posterior subcapsular cataract; ERM epi-retinal membrane; PVD posterior vitreous detachment; RD retinal detachment; DM diabetes mellitus; DR diabetic retinopathy; NPDR non-proliferative diabetic retinopathy; PDR proliferative diabetic retinopathy; CSME clinically significant macular edema; DME diabetic macular  edema; dbh dot blot hemorrhages; CWS cotton wool spot; POAG primary open angle glaucoma; C/D cup-to-disc ratio; HVF humphrey visual field; GVF goldmann visual field; OCT optical coherence tomography; IOP intraocular pressure; BRVO Branch retinal vein occlusion; CRVO central retinal vein occlusion; CRAO central retinal artery occlusion; BRAO branch retinal artery occlusion; RT retinal tear; SB scleral buckle; PPV pars plana vitrectomy; VH Vitreous hemorrhage; PRP panretinal laser photocoagulation; IVK intravitreal kenalog; VMT vitreomacular traction; MH Macular hole;  NVD neovascularization of the disc; NVE neovascularization elsewhere; AREDS age related eye disease study; ARMD age related macular degeneration; POAG primary open angle glaucoma; EBMD epithelial/anterior basement membrane dystrophy; ACIOL anterior chamber intraocular lens; IOL intraocular lens; PCIOL posterior chamber intraocular lens; Phaco/IOL phacoemulsification with intraocular lens placement; PRK photorefractive keratectomy; LASIK laser assisted in situ keratomileusis; HTN hypertension; DM diabetes mellitus; COPD chronic obstructive pulmonary disease

## 2024-02-12 ENCOUNTER — Encounter (INDEPENDENT_AMBULATORY_CARE_PROVIDER_SITE_OTHER): Payer: Self-pay | Admitting: Ophthalmology

## 2024-02-12 ENCOUNTER — Ambulatory Visit (INDEPENDENT_AMBULATORY_CARE_PROVIDER_SITE_OTHER): Admitting: Ophthalmology

## 2024-02-12 DIAGNOSIS — H3581 Retinal edema: Secondary | ICD-10-CM

## 2024-02-12 DIAGNOSIS — H43812 Vitreous degeneration, left eye: Secondary | ICD-10-CM

## 2024-02-12 DIAGNOSIS — H25813 Combined forms of age-related cataract, bilateral: Secondary | ICD-10-CM

## 2024-02-19 ENCOUNTER — Encounter (INDEPENDENT_AMBULATORY_CARE_PROVIDER_SITE_OTHER): Admitting: Ophthalmology

## 2024-02-19 DIAGNOSIS — H3581 Retinal edema: Secondary | ICD-10-CM

## 2024-03-17 DIAGNOSIS — R748 Abnormal levels of other serum enzymes: Secondary | ICD-10-CM | POA: Diagnosis not present

## 2024-03-17 DIAGNOSIS — E039 Hypothyroidism, unspecified: Secondary | ICD-10-CM | POA: Diagnosis not present

## 2024-03-19 NOTE — Progress Notes (Signed)
 [facility] - Clinic Note  03/25/2024   CHIEF COMPLAINT Patient presents for Retina Follow Up  HISTORY OF PRESENT ILLNESS: Bobby Gross is a 68 y.o. male who presents to the clinic today for:  HPI     Retina Follow Up   In left eye.  This started 6 weeks ago.  Duration of 6 weeks.  Since onset it is stable.        Comments   6 week retina follow up PVD OS pt is reporting no vision changes noticed he denies any flashes has floaters       Last edited by Resa Delon ORN, COT on 03/25/2024  8:40 AM.     Pt states he sees the floater all the time and it annoying.  Referring physician: Lari Elspeth BRAVO, MD [address] [place],  [place] [address]  HISTORICAL INFORMATION:  Selected notes from the MEDICAL RECORD NUMBER Referred by Dr. Juli for PVD w/ visually significant vitreous opacities LEE:  Ocular Hx- PMH-   CURRENT MEDICATIONS: Current Outpatient Medications (Ophthalmic Drugs)  Medication Sig   Naphazoline-Pheniramine (OPCON-A) 0.027-0.315 % SOLN Place 1 drop into both eyes in the morning.   No current facility-administered medications for this visit. (Ophthalmic Drugs)   Current Outpatient Medications (Other)  Medication Sig   cetirizine (ZYRTEC) 10 MG tablet Take 10 mg by mouth daily as needed for allergies.   cyanocobalamin  (VITAMIN B12) 1000 MCG/ML injection INJECT 1 ML INTRAMUSCULARLY  EVERY 30 DAYS (DISCARD 28 DAYS  AFTER FIRST USE)   doxazosin (CARDURA) 4 MG tablet Take 4 mg by mouth daily.   levothyroxine (SYNTHROID) 100 MCG tablet Take 100 mcg by mouth daily.   mesalamine  (LIALDA ) 1.2 g EC tablet TAKE 4 TABLETS BY MOUTH DAILY   Multiple Vitamin (MULTIVITAMIN WITH MINERALS) TABS Take 1 tablet by mouth in the morning.   Omega-3 Fatty Acids (OMEGA-3 EPA FISH OIL PO) Take 1,000 mg by mouth daily with breakfast.   TURMERIC PO Take 2 capsules by mouth daily with supper.   levothyroxine (SYNTHROID) 88 MCG tablet Take 88 mcg by mouth daily  at 4 PM. (Patient not taking: Reported on 02/12/2024)   No current facility-administered medications for this visit. (Other)   REVIEW OF SYSTEMS: ROS   Positive for: Gastrointestinal, Musculoskeletal, Eyes, Allergic/Imm Negative for: Constitutional, Neurological, Skin, Genitourinary, HENT, Endocrine, Cardiovascular, Respiratory, Psychiatric, Heme/Lymph Last edited by Resa Delon ORN, COT on 03/25/2024  8:40 AM.      ALLERGIES No Known Allergies PAST MEDICAL HISTORY Past Medical History:  Diagnosis Date   B12 deficiency    Following small bowel resection   Cancer (HCC)    skin   Crohn's disease (HCC)    Involving small bowel   History of kidney stones    History of nephrolithiasis    Hyperlipemia    Hypothyroidism    Past Surgical History:  Procedure Laterality Date   BIOPSY  01/31/2017   Procedure: BIOPSY;  Surgeon: Shaaron Lamar HERO, MD;  [facility];  Service: Endoscopy;;  biopsy     BIOPSY  12/20/2021   Procedure: BIOPSY;  Surgeon: Shaaron Lamar HERO, MD;  [facility];  Service: Endoscopy;;  biopsies of anastomosis(stricture)   CERVICAL SPINE SURGERY  11/07/2021   C5/6 disc replacement   COLONOSCOPY  10/03/2006   Normal rectum/Status post right hemicolectomy with a small bowel anastomosis,  fibrotic-appearing anastomosis consistent with Crohn's, status post  biopsy, but overall, things look really good  today   COLONOSCOPY  01/28/2012   Dr. Waddell R hemicolectomy for ileocolonic crohn's disease. disease appears inactive at this time.  all Bx's benign.   COLONOSCOPY N/A 01/31/2017   Procedure: COLONOSCOPY;  Surgeon: Shaaron Lamar HERO, MD;  [facility];  Service: Endoscopy;  Laterality: N/A;  7:30am   COLONOSCOPY WITH PROPOFOL  N/A 12/20/2021   Procedure: COLONOSCOPY WITH PROPOFOL ;  Surgeon: Shaaron Lamar HERO, MD;  [facility];  Service: Endoscopy;  Laterality: N/A;  11:15am   DEXA scan  09/2010   LEFT HEART CATH AND  CORONARY ANGIOGRAPHY N/A 04/10/2017   Procedure: LEFT HEART CATH AND CORONARY ANGIOGRAPHY;  Surgeon: Claudene Victory ORN, MD;  [facility];  Service: Cardiovascular;  Laterality: N/A;   OPEN SURGICAL REPAIR OF GLUTEAL TENDON Left 05/10/2023   Procedure: Left hip open gluteus medius repair;  Surgeon: Sharl Selinda Dover, MD;  Location: [facility];  Service: Orthopedics;  Laterality: Left;   RIGHT FOOT SURGER  2009   PLANTAR FASCITIS   RIGHT SHOULDER SURGERY     RIGHT WRIST SURGERY     SMALL INTESTINE SURGERY  1988   AT [facility]   TONSILLECTOMY     FAMILY HISTORY Family History  Problem Relation Age of Onset   Colon cancer Other    SOCIAL HISTORY Social History   Tobacco Use   Smoking status: Never   Smokeless tobacco: Never   Tobacco comments:    Never smoker  Vaping Use   Vaping status: Never Used  Substance Use Topics   Alcohol  use: No   Drug use: No       OPHTHALMIC EXAM:  Base Eye Exam     Visual Acuity (Snellen - Linear)       Right Left   Dist cc 20/25 20/20   Dist ph cc NI NI         Tonometry (Tonopen, 8:45 AM)       Right Left   Pressure 18 18         Pupils       Pupils Dark Light Shape React APD   Right PERRL 3 2 Round Brisk None   Left PERRL 3 2 Round Brisk None         Visual Fields       Left Right    Full Full         Extraocular Movement       Right Left    Full, Ortho Full, Ortho         Neuro/Psych     Oriented x3: Yes   Mood/Affect: Normal         Dilation     Both eyes: 2.5% Phenylephrine @ 8:45 AM           Slit Lamp and Fundus Exam     Slit Lamp Exam       Right Left   Lids/Lashes Dermatochalasis - upper lid, Meibomian gland dysfunction Dermatochalasis - upper lid, Meibomian gland dysfunction   Conjunctiva/Sclera White and quiet Mild nasal and temporal pinguecula   Cornea 4 cut RK, mild arcus, nasal and temporal LRI Clear   Anterior Chamber deep and clear deep and clear   Iris Round and  dilated Round and dilated   Lens 2+ Nuclear sclerosis, 2+ Cortical cataract 2+ Nuclear sclerosis, 2+ Cortical cataract   Anterior Vitreous mild syneresis mild syneresis, Posterior vitreous detachment with prominent, fibrotic weiss ring  Fundus Exam       Right Left   Disc Pink and Sharp Pink and Sharp   C/D Ratio 0.55 0.3   Macula Flat, Good foveal reflex, RPE mottling, No heme or edema Flat, Good foveal reflex, Drusen, RPE mottling, No heme or edema   Vessels mild attenuation, mild tortuosity attenuated, mild tortuosity   Periphery Attached, reticular degeneration, No heme Attached; no RT/RD           Refraction     Wearing Rx       Sphere Cylinder Axis   Right +0.75 +0.50 035   Left -1.50 +2.25 002    Type: SVL           IMAGING AND PROCEDURES  Imaging and Procedures for 03/25/2024         ASSESSMENT/PLAN:   ICD-10-CM   1. Posterior vitreous detachment of left eye  H43.812 OCT, Retina - OU - Both Eyes    2. Combined forms of age-related cataract of both eyes  H25.813       PVD OS - onset in February 2025 -- persistent prominent symptomatic floater x5 mos without significant improvement  - pt reports significant impact on activities of daily living  - Discussed findings, prognosis, and treatment options  - No RT or RD on 360 scleral depressed exam  - Reviewed s/s of RT/RD - Strict return precautions for any such RT/RD signs/symptoms - OCT today shows Interval improvement in focal vitreous opacities - discussed surgery for visually significant vitreous opacities -- risks, benefits, and alternatives to surgery - recommend continued monitoring for now, patient has decided to wait at this time  - f/u in 7-8 wks -- DFE/OCT, possible surgical planning  2. Mixed Cataract OU  The symptoms of cataract, surgical options, and treatments and risks were discussed with patient. - discussed diagnosis and progression - monitor  Ophthalmic Meds Ordered this  visit:  No orders of the defined types were placed in this encounter.    No follow-ups on file.  There are no Patient Instructions on file for this visit.  Explained the diagnoses, plan, and follow up with the patient and they expressed understanding.  Patient expressed understanding of the importance of proper follow up care.  This document serves as a record of services personally performed by [name] Hans, MD, PhD. It was created on their behalf by Almetta Pesa, an ophthalmic technician. The creation of this record is the provider's dictation and/or activities during the visit.    Electronically signed by: Almetta Pesa, OA, 03/25/24  9:22 AM   This document serves as a record of services personally performed by [name] Hans, MD, PhD. It was created on their behalf by Wanda GEANNIE Keens, COT an ophthalmic technician. The creation of this record is the provider's dictation and/or activities during the visit.    Electronically signed by:  Wanda GEANNIE Keens, COT  03/25/24 9:22 AM   [name] Hans, M.D., Ph.D. Diseases & Surgery of the Retina and Vitreous [facility] 03/25/2024   Abbreviations: M myopia (nearsighted); A astigmatism; H hyperopia (farsighted); P presbyopia; Mrx spectacle prescription;  CTL contact lenses; OD right eye; OS left eye; OU both eyes  XT exotropia; ET esotropia; PEK punctate epithelial keratitis; PEE punctate epithelial erosions; DES dry eye syndrome; MGD meibomian gland dysfunction; ATs artificial tears; PFAT's preservative free artificial tears; NSC nuclear sclerotic cataract; PSC posterior subcapsular cataract; ERM epi-retinal membrane; PVD posterior vitreous detachment; RD retinal detachment;  DM diabetes mellitus; DR diabetic retinopathy; NPDR non-proliferative diabetic retinopathy; PDR proliferative diabetic retinopathy; CSME clinically significant macular edema; DME diabetic macular edema; dbh dot blot hemorrhages; CWS  cotton wool spot; POAG primary open angle glaucoma; C/D cup-to-disc ratio; HVF humphrey visual field; GVF goldmann visual field; OCT optical coherence tomography; IOP intraocular pressure; BRVO Branch retinal vein occlusion; CRVO central retinal vein occlusion; CRAO central retinal artery occlusion; BRAO branch retinal artery occlusion; RT retinal tear; SB scleral buckle; PPV pars plana vitrectomy; VH Vitreous hemorrhage; PRP panretinal laser photocoagulation; IVK intravitreal kenalog; VMT vitreomacular traction; MH Macular hole;  NVD neovascularization of the disc; NVE neovascularization elsewhere; AREDS age related eye disease study; ARMD age related macular degeneration; POAG primary open angle glaucoma; EBMD epithelial/anterior basement membrane dystrophy; ACIOL anterior chamber intraocular lens; IOL intraocular lens; PCIOL posterior chamber intraocular lens; Phaco/IOL phacoemulsification with intraocular lens placement; PRK photorefractive keratectomy; LASIK laser assisted in situ keratomileusis; HTN hypertension; DM diabetes mellitus; COPD chronic obstructive pulmonary disease

## 2024-03-25 ENCOUNTER — Ambulatory Visit (INDEPENDENT_AMBULATORY_CARE_PROVIDER_SITE_OTHER): Admitting: Ophthalmology

## 2024-03-25 ENCOUNTER — Encounter (INDEPENDENT_AMBULATORY_CARE_PROVIDER_SITE_OTHER): Payer: Self-pay | Admitting: Ophthalmology

## 2024-03-25 DIAGNOSIS — H43812 Vitreous degeneration, left eye: Secondary | ICD-10-CM | POA: Diagnosis not present

## 2024-03-25 DIAGNOSIS — H25813 Combined forms of age-related cataract, bilateral: Secondary | ICD-10-CM | POA: Diagnosis not present

## 2024-03-27 ENCOUNTER — Encounter (INDEPENDENT_AMBULATORY_CARE_PROVIDER_SITE_OTHER): Payer: Self-pay | Admitting: Ophthalmology

## 2024-04-16 ENCOUNTER — Encounter: Payer: Self-pay | Admitting: Internal Medicine

## 2024-05-18 NOTE — Progress Notes (Signed)
 [facility] - Clinic Note  06/01/2024   CHIEF COMPLAINT Patient presents for Retina Follow Up  HISTORY OF PRESENT ILLNESS: Bobby Gross is a 68 y.o. male who presents to the clinic today for:  HPI     Retina Follow Up   In left eye.  This started 8 weeks ago.  Duration of weeks.  Since onset it is stable.  I, the attending physician,  performed the HPI with the patient and updated documentation appropriately.        Comments   8 week retina follow up PVD OS pt is reporting vision seems about the same he denies any flashes just has floaters       Last edited by Valdemar Rogue, MD on 06/07/2024  3:39 PM.     Pt states floater is still the same in OS, sees it consistently. Had decided mid Sept he wanted surgery.   Referring physician: Lari Elspeth BRAVO, MD [address] [place],  [place] [address]  HISTORICAL INFORMATION:  Selected notes from the MEDICAL RECORD NUMBER Referred by Dr. Juli for PVD w/ visually significant vitreous opacities LEE:  Ocular Hx- PMH-   CURRENT MEDICATIONS: Current Outpatient Medications (Ophthalmic Drugs)  Medication Sig   Naphazoline-Pheniramine (OPCON-A) 0.027-0.315 % SOLN Place 1 drop into both eyes in the morning.   No current facility-administered medications for this visit. (Ophthalmic Drugs)   Current Outpatient Medications (Other)  Medication Sig   cetirizine (ZYRTEC) 10 MG tablet Take 10 mg by mouth daily as needed for allergies.   cyanocobalamin  (VITAMIN B12) 1000 MCG/ML injection INJECT 1 ML INTRAMUSCULARLY  EVERY 30 DAYS (DISCARD 28 DAYS  AFTER FIRST USE)   doxazosin (CARDURA) 4 MG tablet Take 4 mg by mouth daily.   levothyroxine (SYNTHROID) 100 MCG tablet Take 100 mcg by mouth daily.   mesalamine  (LIALDA ) 1.2 g EC tablet TAKE 4 TABLETS BY MOUTH DAILY   Multiple Vitamin (MULTIVITAMIN WITH MINERALS) TABS Take 1 tablet by mouth in the morning.   Omega-3 Fatty Acids (OMEGA-3 EPA FISH OIL PO) Take 1,000 mg by mouth  daily with breakfast.   TURMERIC PO Take 2 capsules by mouth daily with supper.   levothyroxine (SYNTHROID) 88 MCG tablet Take 88 mcg by mouth daily at 4 PM. (Patient not taking: Reported on 02/12/2024)   No current facility-administered medications for this visit. (Other)   REVIEW OF SYSTEMS: ROS   Positive for: Gastrointestinal, Musculoskeletal, Eyes, Allergic/Imm Negative for: Constitutional, Neurological, Skin, Genitourinary, HENT, Endocrine, Cardiovascular, Respiratory, Psychiatric, Heme/Lymph Last edited by Resa Delon ORN, COT on 06/01/2024  8:35 AM.       ALLERGIES No Known Allergies PAST MEDICAL HISTORY Past Medical History:  Diagnosis Date   B12 deficiency    Following small bowel resection   Cancer (HCC)    skin   Crohn's disease (HCC)    Involving small bowel   History of kidney stones    History of nephrolithiasis    Hyperlipemia    Hypothyroidism    Past Surgical History:  Procedure Laterality Date   BIOPSY  01/31/2017   Procedure: BIOPSY;  Surgeon: Shaaron Lamar HERO, MD;  [facility];  Service: Endoscopy;;  biopsy     BIOPSY  12/20/2021   Procedure: BIOPSY;  Surgeon: Shaaron Lamar HERO, MD;  [facility];  Service: Endoscopy;;  biopsies of anastomosis(stricture)   CERVICAL SPINE SURGERY  11/07/2021   C5/6 disc replacement   COLONOSCOPY  10/03/2006   Normal  rectum/Status post right hemicolectomy with a small bowel anastomosis,  fibrotic-appearing anastomosis consistent with Crohn's, status post  biopsy, but overall, things look really good today   COLONOSCOPY  01/28/2012   Dr. Waddell R hemicolectomy for ileocolonic crohn's disease. disease appears inactive at this time.  all Bx's benign.   COLONOSCOPY N/A 01/31/2017   Procedure: COLONOSCOPY;  Surgeon: Shaaron Lamar HERO, MD;  [facility];  Service: Endoscopy;  Laterality: N/A;  7:30am   COLONOSCOPY WITH PROPOFOL  N/A 12/20/2021   Procedure: COLONOSCOPY WITH PROPOFOL ;   Surgeon: Shaaron Lamar HERO, MD;  [facility];  Service: Endoscopy;  Laterality: N/A;  11:15am   DEXA scan  09/2010   LEFT HEART CATH AND CORONARY ANGIOGRAPHY N/A 04/10/2017   Procedure: LEFT HEART CATH AND CORONARY ANGIOGRAPHY;  Surgeon: Claudene Victory ORN, MD;  [facility];  Service: Cardiovascular;  Laterality: N/A;   OPEN SURGICAL REPAIR OF GLUTEAL TENDON Left 05/10/2023   Procedure: Left hip open gluteus medius repair;  Surgeon: Sharl Selinda Dover, MD;  Location: [facility];  Service: Orthopedics;  Laterality: Left;   RIGHT FOOT SURGER  2009   PLANTAR FASCITIS   RIGHT SHOULDER SURGERY     RIGHT WRIST SURGERY     SMALL INTESTINE SURGERY  1988   AT [facility]   TONSILLECTOMY     FAMILY HISTORY Family History  Problem Relation Age of Onset   Colon cancer Other    SOCIAL HISTORY Social History   Tobacco Use   Smoking status: Never   Smokeless tobacco: Never   Tobacco comments:    Never smoker  Vaping Use   Vaping status: Never Used  Substance Use Topics   Alcohol  use: No   Drug use: No       OPHTHALMIC EXAM:  Base Eye Exam     Visual Acuity (Snellen - Linear)       Right Left   Dist cc 20/20 -2 20/20    Correction: Glasses         Tonometry (Tonopen, 8:40 AM)       Right Left   Pressure 18 18         Pupils       Pupils Dark Light Shape React APD   Right PERRL 3 2 Round Brisk None   Left PERRL 3 2 Round Brisk None         Visual Fields       Left Right    Full Full         Extraocular Movement       Right Left    Full, Ortho Full, Ortho         Neuro/Psych     Oriented x3: Yes   Mood/Affect: Normal         Dilation     Both eyes: 2.5% Phenylephrine @ 8:40 AM           Slit Lamp and Fundus Exam     Slit Lamp Exam       Right Left   Lids/Lashes Dermatochalasis - upper lid, Meibomian gland dysfunction Dermatochalasis - upper lid, Meibomian gland dysfunction   Conjunctiva/Sclera White and quiet Mild  nasal and temporal pinguecula   Cornea 4 cut RK, mild arcus, nasal and temporal LRI Clear   Anterior Chamber deep and clear deep and clear   Iris Round and dilated Round and dilated   Lens 2+ Nuclear sclerosis, 2+ Cortical cataract 2+ Nuclear sclerosis, 2+ Cortical cataract  Anterior Vitreous mild syneresis mild syneresis, Posterior vitreous detachment with prominent, fibrotic weiss ring         Fundus Exam       Right Left   Disc Pink and Sharp Pink and Sharp   C/D Ratio 0.55 0.4   Macula Flat, Good foveal reflex, fine drusen, RPE mottling, No heme or edema Flat, Good foveal reflex, fine Drusen, RPE mottling, No heme or edema   Vessels mild attenuation, mild tortuosity attenuated, mild tortuosity   Periphery Attached, reticular degeneration, No heme Attached; no RT/RD           Refraction     Wearing Rx       Sphere Cylinder Axis   Right +0.75 +0.50 035   Left -1.50 +2.25 002    Type: SVL           IMAGING AND PROCEDURES  Imaging and Procedures for 06/01/2024  OCT, Retina - OU - Both Eyes       Right Eye Quality was good. Central Foveal Thickness: 298. Progression has been stable. Findings include normal foveal contour, no IRF, no SRF, retinal drusen (Partial PVD).   Left Eye Quality was good. Central Foveal Thickness: 307. Progression has been stable. Findings include normal foveal contour, no IRF, no SRF, retinal drusen (Mild interval improvement in focal vit opacity).   Notes *Images captured and stored on drive  Diagnosis / Impression:  NFP, no IRF/SRF OU  Clinical management:  See below  Abbreviations: NFP - Normal foveal profile. CME - cystoid macular edema. PED - pigment epithelial detachment. IRF - intraretinal fluid. SRF - subretinal fluid. EZ - ellipsoid zone. ERM - epiretinal membrane. ORA - outer retinal atrophy. ORT - outer retinal tubulation. SRHM - subretinal hyper-reflective material. IRHM - intraretinal hyper-reflective material             ASSESSMENT/PLAN:   ICD-10-CM   1. Posterior vitreous detachment of left eye  H43.812 OCT, Retina - OU - Both Eyes    2. Vitreous opacities of left eye  H43.392     3. Combined forms of age-related cataract of both eyes  H25.813       1,2. PVD OS -- visually significant vitreous opacities - onset in February 2025 -- persistent prominent symptomatic floater x9 mos without significant improvement  - pt reports significant impact on activities of daily living  - Discussed findings, prognosis, and treatment options  - No RT or RD on 360 scleral depressed exam  - Reviewed s/s of RT/RD - Strict return precautions for any such RT/RD signs/symptoms - OCT OS today shows mild interval improvement in focal vitreous opacities - discussed surgery for visually significant vitreous opacities - risks, benefits, and alternatives to surgery - pt wishes to proceed with surgery - recommend 25g PPV OS -- pt in agreement and wishes to have surgery on 12.04.25  - f/u 12.5.25 for POV1 s/p PPV OS -- DFE/OCT OU  3. Mixed Cataract OU  The symptoms of cataract, surgical options, and treatments and risks were discussed with patient. - discussed diagnosis and progression - monitor  Ophthalmic Meds Ordered this visit:  No orders of the defined types were placed in this encounter.    Return in about 1 month (around 07/03/2024) for POV s/p PPV OS, DFE, OCT OU.  There are no Patient Instructions on file for this visit.  Explained the diagnoses, plan, and follow up with the patient and they expressed understanding.  Patient expressed understanding of the importance of  proper follow up care.  This document serves as a record of services personally performed by [name] Hans, MD, PhD. It was created on their behalf by Avelina Pereyra, COA an ophthalmic technician. The creation of this record is the provider's dictation and/or activities during the visit.   Electronically signed by: Avelina GORMAN Pereyra, COT   06/07/24  3:45 PM   This document serves as a record of services personally performed by [name] Hans, MD, PhD. It was created on their behalf by Almetta Pesa, an ophthalmic technician. The creation of this record is the provider's dictation and/or activities during the visit.    Electronically signed by: Almetta Pesa, OA, 06/07/24  3:45 PM   [name] Hans, M.D., Ph.D. Diseases & Surgery of the Retina and Vitreous [facility] 06/01/2024  I have reviewed the above documentation for accuracy and completeness, and I agree with the above. [name] Hans, M.D., Ph.D. 06/07/24 3:46 PM   Abbreviations: M myopia (nearsighted); A astigmatism; H hyperopia (farsighted); P presbyopia; Mrx spectacle prescription;  CTL contact lenses; OD right eye; OS left eye; OU both eyes  XT exotropia; ET esotropia; PEK punctate epithelial keratitis; PEE punctate epithelial erosions; DES dry eye syndrome; MGD meibomian gland dysfunction; ATs artificial tears; PFAT's preservative free artificial tears; NSC nuclear sclerotic cataract; PSC posterior subcapsular cataract; ERM epi-retinal membrane; PVD posterior vitreous detachment; RD retinal detachment; DM diabetes mellitus; DR diabetic retinopathy; NPDR non-proliferative diabetic retinopathy; PDR proliferative diabetic retinopathy; CSME clinically significant macular edema; DME diabetic macular edema; dbh dot blot hemorrhages; CWS cotton wool spot; POAG primary open angle glaucoma; C/D cup-to-disc ratio; HVF humphrey visual field; GVF goldmann visual field; OCT optical coherence tomography; IOP intraocular pressure; BRVO Branch retinal vein occlusion; CRVO central retinal vein occlusion; CRAO central retinal artery occlusion; BRAO branch retinal artery occlusion; RT retinal tear; SB scleral buckle; PPV pars plana vitrectomy; VH Vitreous hemorrhage; PRP panretinal laser photocoagulation; IVK intravitreal kenalog; VMT vitreomacular traction; MH  Macular hole;  NVD neovascularization of the disc; NVE neovascularization elsewhere; AREDS age related eye disease study; ARMD age related macular degeneration; POAG primary open angle glaucoma; EBMD epithelial/anterior basement membrane dystrophy; ACIOL anterior chamber intraocular lens; IOL intraocular lens; PCIOL posterior chamber intraocular lens; Phaco/IOL phacoemulsification with intraocular lens placement; PRK photorefractive keratectomy; LASIK laser assisted in situ keratomileusis; HTN hypertension; DM diabetes mellitus; COPD chronic obstructive pulmonary disease

## 2024-06-01 ENCOUNTER — Ambulatory Visit (INDEPENDENT_AMBULATORY_CARE_PROVIDER_SITE_OTHER): Admitting: Ophthalmology

## 2024-06-01 ENCOUNTER — Encounter (INDEPENDENT_AMBULATORY_CARE_PROVIDER_SITE_OTHER): Payer: Self-pay | Admitting: Ophthalmology

## 2024-06-01 DIAGNOSIS — H43392 Other vitreous opacities, left eye: Secondary | ICD-10-CM | POA: Diagnosis not present

## 2024-06-01 DIAGNOSIS — H43812 Vitreous degeneration, left eye: Secondary | ICD-10-CM

## 2024-06-01 DIAGNOSIS — H25813 Combined forms of age-related cataract, bilateral: Secondary | ICD-10-CM | POA: Diagnosis not present

## 2024-06-07 ENCOUNTER — Encounter (INDEPENDENT_AMBULATORY_CARE_PROVIDER_SITE_OTHER): Payer: Self-pay | Admitting: Ophthalmology

## 2024-06-16 ENCOUNTER — Other Ambulatory Visit: Payer: Self-pay

## 2024-06-16 ENCOUNTER — Telehealth: Payer: Self-pay | Admitting: Internal Medicine

## 2024-06-16 ENCOUNTER — Encounter: Payer: Self-pay | Admitting: Internal Medicine

## 2024-06-16 ENCOUNTER — Ambulatory Visit: Admitting: Internal Medicine

## 2024-06-16 VITALS — BP 130/86 | HR 74 | Temp 97.8°F | Ht 72.0 in | Wt 175.4 lb

## 2024-06-16 DIAGNOSIS — K50018 Crohn's disease of small intestine with other complication: Secondary | ICD-10-CM

## 2024-06-16 DIAGNOSIS — K5000 Crohn's disease of small intestine without complications: Secondary | ICD-10-CM

## 2024-06-16 NOTE — Patient Instructions (Signed)
 Good to see you again today!  Lets cut back on the Lialda  to 2 tablets daily  Please get me [facility] labs for review  Additional labs may be requested  We will keep you supplied with Lialda  which is an off label for Crohn's disease  Vitamin B12 injections  Let us  know when you find out about your new pharmacy and we will apply refill prescriptions.  Office visit here in 6 months.

## 2024-06-16 NOTE — Telephone Encounter (Signed)
 Received labs from [facility] via fax.  CMET looks good PSA 2.8 no CBC.  Lets get a CBC and a CRP to wrap-up what we need for updated labs.

## 2024-06-16 NOTE — Telephone Encounter (Signed)
 Pt was made aware to have labs done at [facility]

## 2024-06-16 NOTE — Progress Notes (Unsigned)
 Gastroenterology Progress Note    Primary Care Physician:  Lari Elspeth BRAVO, MD Primary Gastroenterologist:  Dr. Shaaron  Pre-Procedure History & Physical: HPI:  Bobby Gross is a 68 y.o. male here  for follow-up of a nearly 40-year history of ileal fiber stenosing Crohn's disease.  He really achieved a durable long-term remission with surgical resection x 1.  7 mm fixed fibrostenotic stricture of the neoterminal ileum 2023 colonoscopy.  No active inflammation.  He has been maintained on off label mesalamine  in the way of Lialda  4.8 g daily appears good Haematologist insurance.  We have had discussions along the way about coming off Lialda  entirely and seeing how he does.  At this time he does not appear to need escalation in treatment. 1-3 bowel movements daily.  Skin fruits and heavy foods sometimes gives him diarrhea.  Overall, he is doing well last colonoscopy 2023.  He gets B12 injections regularly he just got his flu shot.  Normal bone density study 3 years ago.  Left hip surgery earlier this year.  Having a vitrectomy in the near future.  Labs ordered back in spring not done through our labs.  He did have recent labs to [facility].  Past Medical History:  Diagnosis Date   B12 deficiency    Following small bowel resection   Cancer (HCC)    skin   Crohn's disease (HCC)    Involving small bowel   History of kidney stones    History of nephrolithiasis    Hyperlipemia    Hypothyroidism     Past Surgical History:  Procedure Laterality Date   BIOPSY  01/31/2017   Procedure: BIOPSY;  Surgeon: Shaaron Lamar HERO, MD;  [facility];  Service: Endoscopy;;  biopsy     BIOPSY  12/20/2021   Procedure: BIOPSY;  Surgeon: Shaaron Lamar HERO, MD;  [facility];  Service: Endoscopy;;  biopsies of anastomosis(stricture)   CERVICAL SPINE SURGERY  11/07/2021   C5/6 disc replacement   COLONOSCOPY  10/03/2006   Normal rectum/Status post right hemicolectomy with a small  bowel anastomosis,  fibrotic-appearing anastomosis consistent with Crohn's, status post  biopsy, but overall, things look really good today   COLONOSCOPY  01/28/2012   Dr. Waddell R hemicolectomy for ileocolonic crohn's disease. disease appears inactive at this time.  all Bx's benign.   COLONOSCOPY N/A 01/31/2017   Procedure: COLONOSCOPY;  Surgeon: Shaaron Lamar HERO, MD;  [facility];  Service: Endoscopy;  Laterality: N/A;  7:30am   COLONOSCOPY WITH PROPOFOL  N/A 12/20/2021   Procedure: COLONOSCOPY WITH PROPOFOL ;  Surgeon: Shaaron Lamar HERO, MD;  [facility];  Service: Endoscopy;  Laterality: N/A;  11:15am   DEXA scan  09/2010   LEFT HEART CATH AND CORONARY ANGIOGRAPHY N/A 04/10/2017   Procedure: LEFT HEART CATH AND CORONARY ANGIOGRAPHY;  Surgeon: Claudene Victory ORN, MD;  [facility];  Service: Cardiovascular;  Laterality: N/A;   OPEN SURGICAL REPAIR OF GLUTEAL TENDON Left 05/10/2023   Procedure: Left hip open gluteus medius repair;  Surgeon: Sharl Selinda Dover, MD;  Location: [facility];  Service: Orthopedics;  Laterality: Left;   RIGHT FOOT SURGER  2009   PLANTAR FASCITIS   RIGHT SHOULDER SURGERY     RIGHT WRIST SURGERY     SMALL INTESTINE SURGERY  1988   AT [facility]   TONSILLECTOMY      Prior to Admission medications   Medication Sig Start Date End Date Taking? Authorizing  Provider  amoxicillin-clavulanate (AUGMENTIN) 875-125 MG tablet Take 1 tablet by mouth 2 (two) times daily. 06/10/24  Yes [provider]  cetirizine (ZYRTEC) 10 MG tablet Take 10 mg by mouth daily as needed for allergies.   Yes [provider]  cyanocobalamin  (VITAMIN B12) 1000 MCG/ML injection INJECT 1 ML INTRAMUSCULARLY  EVERY 30 DAYS (DISCARD 28 DAYS  AFTER FIRST USE) 01/27/24  Yes Irlene Crudup, Lamar HERO, MD  doxazosin (CARDURA) 4 MG tablet Take 4 mg by mouth daily. 09/11/23  Yes [provider]  levothyroxine (SYNTHROID) 100 MCG tablet Take 100 mcg by mouth daily.  12/10/23  Yes [provider]  mesalamine  (LIALDA ) 1.2 g EC tablet TAKE 4 TABLETS BY MOUTH DAILY 01/24/24  Yes Kennedy Charmaine CROME, NP  Multiple Vitamin (MULTIVITAMIN WITH MINERALS) TABS Take 1 tablet by mouth in the morning.   Yes [provider]  Naphazoline-Pheniramine (OPCON-A) 0.027-0.315 % SOLN Place 1 drop into both eyes in the morning.   Yes [provider]  Omega-3 Fatty Acids (OMEGA-3 EPA FISH OIL PO) Take 1,000 mg by mouth daily with breakfast.   Yes [provider]  TURMERIC PO Take 2 capsules by mouth daily with supper.   Yes [provider]    Allergies as of 06/16/2024   (No Known Allergies)    Family History  Problem Relation Age of Onset   Colon cancer Other     Social History   Socioeconomic History   Marital status: Married    Spouse name: Not on file   Number of children: Not on file   Years of education: Not on file   Highest education level: Not on file  Occupational History   Not on file  Tobacco Use   Smoking status: Never   Smokeless tobacco: Never   Tobacco comments:    Never smoker  Vaping Use   Vaping status: Never Used  Substance and Sexual Activity   Alcohol  use: No   Drug use: No   Sexual activity: Yes  Other Topics Concern   Not on file  Social History Narrative   Not on file   Social Drivers of Health   Financial Resource Strain: Not on file  Food Insecurity: Not on file  Transportation Needs: Not on file  Physical Activity: Not on file  Stress: Not on file  Social Connections: Not on file  Intimate Partner Violence: Not on file    Review of Systems   See HPI, otherwise negative ROS  Physical Exam: BP 130/86   Pulse 74   Temp 97.8 F (36.6 C) (Oral)   Ht 6' (1.829 m)   Wt 175 lb 6.4 oz (79.6 kg)   SpO2 98%   BMI 23.79 kg/m  General:   Alert,  Well-developed, well-nourished, pleasant and cooperative in NAD Neck:  Supple; no masses or thyromegaly. No significant cervical  adenopathy. Lungs:  Clear throughout to auscultation.   No wheezes, crackles, or rhonchi. No acute distress. Heart:  Regular rate and rhythm; no murmurs, clicks, rubs,  or gallops. Abdomen: Non-distended, normal bowel sounds.  Soft and nontender without appreciable mass or hepatosplenomegaly.    Impression/Plan:   68 year old gentleman with a near 4 decade fibrostenosing small bowel Crohn's disease.  He achieved surgical remission at [facility] more or less 40 years ago.  He has been maintained on off label mesalamine  for minimal symptomatic disease for stricture in 2023 without active activity on histology.  I suspect that mesalamine  is more or less homeopathic at  this point in time.  Because of cost he is wondering about de-escalating therapy we have talked about that on multiple occasions.  Although there is a risk of flare I think we can probably drop back and see how he does.  Recommendations:   Lets cut back on the Lialda  to 2 tablets daily  Please get me [facility] labs for review  Additional labs may be requested  We will keep you supplied with Lialda  which is an off label for Crohn's disease  Vitamin B12 injections  Let us  know when you find out about your new pharmacy and we will apply refill prescriptions.  Office visit here in 6 months.  Notice: This dictation was prepared with Dragon dictation along with smaller phrase technology. Any transcriptional errors that result from this process are unintentional and may not be corrected upon review.

## 2024-06-18 LAB — CBC WITH DIFFERENTIAL/PLATELET
Basophils Absolute: 0.1 x10E3/uL (ref 0.0–0.2)
Basos: 1 %
EOS (ABSOLUTE): 0 x10E3/uL (ref 0.0–0.4)
Eos: 0 %
Hematocrit: 42.9 % (ref 37.5–51.0)
Hemoglobin: 13.9 g/dL (ref 13.0–17.7)
Immature Grans (Abs): 0 x10E3/uL (ref 0.0–0.1)
Immature Granulocytes: 0 %
Lymphocytes Absolute: 1.3 x10E3/uL (ref 0.7–3.1)
Lymphs: 19 %
MCH: 29 pg (ref 26.6–33.0)
MCHC: 32.4 g/dL (ref 31.5–35.7)
MCV: 89 fL (ref 79–97)
Monocytes Absolute: 0.4 x10E3/uL (ref 0.1–0.9)
Monocytes: 6 %
Neutrophils Absolute: 5 x10E3/uL (ref 1.4–7.0)
Neutrophils: 74 %
Platelets: 238 x10E3/uL (ref 150–450)
RBC: 4.8 x10E6/uL (ref 4.14–5.80)
RDW: 13.6 % (ref 11.6–15.4)
WBC: 6.8 x10E3/uL (ref 3.4–10.8)

## 2024-06-18 LAB — C-REACTIVE PROTEIN: CRP: 1 mg/L (ref 0–10)

## 2024-06-19 ENCOUNTER — Ambulatory Visit: Payer: Self-pay | Admitting: Internal Medicine

## 2024-06-30 NOTE — Progress Notes (Signed)
 [facility] - Clinic Note  07/03/2024   CHIEF COMPLAINT Patient presents for Retina Follow Up  HISTORY OF PRESENT ILLNESS: Bobby Gross is a 68 y.o. male who presents to the clinic today for:  HPI     Retina Follow Up   Patient presents with  PVD.  In left eye.  Severity is moderate.  Duration of 1 day.  Since onset it is stable.  I, the attending physician,  performed the HPI with the patient and updated documentation appropriately.        Comments   Pt here for 1day POV s/p PPV OS for vit floaters. Pt states last night eye was painful, did take an oxycodone  he had from hip surgery. Pt hasn't taken anything since then, reports feels better this am.       Last edited by Valdemar Rogue, MD on 07/05/2024  9:16 PM.    Pt states floater is still the same in OS, sees it consistently. Had decided mid Sept he wanted surgery.   Referring physician: Lari Elspeth BRAVO, MD [address] [place],  [place] [address]  HISTORICAL INFORMATION:  Selected notes from the MEDICAL RECORD NUMBER Referred by Dr. Juli for PVD w/ visually significant vitreous opacities LEE:  Ocular Hx- PMH-   CURRENT MEDICATIONS: Current Outpatient Medications (Ophthalmic Drugs)  Medication Sig   Naphazoline-Pheniramine (OPCON-A) 0.027-0.315 % SOLN Place 1 drop into both eyes daily as needed (Redness).   No current facility-administered medications for this visit. (Ophthalmic Drugs)   Current Outpatient Medications (Other)  Medication Sig   cetirizine (ZYRTEC) 10 MG tablet Take 10 mg by mouth daily as needed for allergies.   cyanocobalamin  (VITAMIN B12) 1000 MCG/ML injection INJECT 1 ML INTRAMUSCULARLY  EVERY 30 DAYS (DISCARD 28 DAYS  AFTER FIRST USE)   doxazosin (CARDURA) 4 MG tablet Take 4 mg by mouth every evening.   levothyroxine (SYNTHROID) 100 MCG tablet Take 100 mcg by mouth daily before breakfast.   mesalamine  (LIALDA ) 1.2 g EC tablet TAKE 4 TABLETS BY MOUTH DAILY (Patient taking  differently: Take 1.2 g by mouth 2 (two) times daily.)   Multiple Vitamin (MULTIVITAMIN WITH MINERALS) TABS Take 1 tablet by mouth in the morning.   Omega-3 Fatty Acids (OMEGA-3 EPA FISH OIL PO) Take 1,000 mg by mouth daily with breakfast.   TURMERIC PO Take 2 capsules by mouth daily with supper.   No current facility-administered medications for this visit. (Other)   REVIEW OF SYSTEMS: ROS   Positive for: Gastrointestinal, Musculoskeletal, Eyes, Allergic/Imm Negative for: Constitutional, Neurological, Skin, Genitourinary, HENT, Endocrine, Cardiovascular, Respiratory, Psychiatric, Heme/Lymph Last edited by Antonetta Almetta BRAVO, COT on 07/03/2024  7:49 AM.     ALLERGIES No Known Allergies PAST MEDICAL HISTORY Past Medical History:  Diagnosis Date   Aortic valve sclerosis 03/09/2020   hx - managed by PCP   Arthritis    no meds   B12 deficiency    Following small bowel resection   Cancer (HCC)    skin - back of neck   Crohn's disease (HCC)    Involving small bowel   History of kidney stones    passed stones and surgery for 1 [place]   Hyperlipemia    Hypothyroidism    Past Surgical History:  Procedure Laterality Date   BIOPSY  01/31/2017   Procedure: BIOPSY;  Surgeon: Shaaron Lamar HERO, MD;  [facility];  Service: Endoscopy;;  biopsy     BIOPSY  12/20/2021  Procedure: BIOPSY;  Surgeon: Shaaron Lamar HERO, MD;  [facility];  Service: Endoscopy;;  biopsies of anastomosis(stricture)   CERVICAL SPINE SURGERY  11/07/2021   C5/6 disc replacement   COLONOSCOPY  10/03/2006   Normal rectum/Status post right hemicolectomy with a small bowel anastomosis,  fibrotic-appearing anastomosis consistent with Crohn's, status post  biopsy, but overall, things look really good today   COLONOSCOPY  01/28/2012   Dr. Waddell R hemicolectomy for ileocolonic crohn's disease. disease appears inactive at this time.  all Bx's benign.   COLONOSCOPY N/A 01/31/2017   Procedure:  COLONOSCOPY;  Surgeon: Shaaron Lamar HERO, MD;  [facility];  Service: Endoscopy;  Laterality: N/A;  7:30am   COLONOSCOPY WITH PROPOFOL  N/A 12/20/2021   Procedure: COLONOSCOPY WITH PROPOFOL ;  Surgeon: Shaaron Lamar HERO, MD;  [facility];  Service: Endoscopy;  Laterality: N/A;  11:15am   DEXA scan  09/2010   LEFT HEART CATH AND CORONARY ANGIOGRAPHY N/A 04/10/2017   Procedure: LEFT HEART CATH AND CORONARY ANGIOGRAPHY;  Surgeon: Claudene Victory ORN, MD;  [facility];  Service: Cardiovascular;  Laterality: N/A;   OPEN SURGICAL REPAIR OF GLUTEAL TENDON Left 05/10/2023   Procedure: Left hip open gluteus medius repair;  Surgeon: Sharl Selinda Dover, MD;  Location: [facility];  Service: Orthopedics;  Laterality: Left;   PARS PLANA VITRECTOMY W/ FOCAL ENDOLASER PHOTOCOAGULATION Left 07/02/2024   Procedure: PARS PLANA VITRECTOMY WITH 25 GAUGE WITH FOCAL ENDOLASER PHOTOCOAGULATION;  Surgeon: Valdemar Rogue, MD;  [facility];  Service: Ophthalmology;  Laterality: Left;   RIGHT FOOT SURGER  2009   PLANTAR FASCITIS   RIGHT SHOULDER SURGERY     RIGHT WRIST SURGERY     SMALL INTESTINE SURGERY  1988   AT [facility]   TONSILLECTOMY     FAMILY HISTORY Family History  Problem Relation Age of Onset   Colon cancer Other    SOCIAL HISTORY Social History   Tobacco Use   Smoking status: Never   Smokeless tobacco: Never   Tobacco comments:    Never smoker  Vaping Use   Vaping status: Never Used  Substance Use Topics   Alcohol  use: No   Drug use: No       OPHTHALMIC EXAM:  Base Eye Exam     Visual Acuity (Snellen - Linear)       Right Left   Dist cc 20/20 20/60 -1   Dist ph cc  20/25 -1         Tonometry (Tonopen, 7:59 AM)       Right Left   Pressure 12 13         Pupils       Dark Light Shape React APD   Right 3 2 Round Brisk None   Left Dilated             Visual Fields (Counting fingers)       Left Right    Full Full         Extraocular  Movement       Right Left    Full, Ortho Full, Ortho         Neuro/Psych     Oriented x3: Yes   Mood/Affect: Normal         Dilation     Both eyes: 1.0% Mydriacyl , 2.5% Phenylephrine  @ 7:59 AM           Slit Lamp and Fundus Exam     Slit Lamp Exam  Right Left   Lids/Lashes Dermatochalasis - upper lid, Meibomian gland dysfunction Dermatochalasis - upper lid, Meibomian gland dysfunction   Conjunctiva/Sclera White and quiet Mild nasal and temporal pinguecula, trace injection, sutures intact   Cornea 4 cut RK, mild arcus, nasal and temporal LRI Debris in tear film   Anterior Chamber deep and clear deep and clear   Iris Round and dilated Round and dilated   Lens 2+ Nuclear sclerosis, 2+ Cortical cataract 2+ Nuclear sclerosis, 2+ Cortical cataract   Anterior Vitreous mild syneresis Post vitrectomy, clear, interval improvement in vit opacities         Fundus Exam       Right Left   Disc Pink and Sharp Pink and Sharp   C/D Ratio 0.55 0.5   Macula Flat, Good foveal reflex, fine drusen, RPE mottling, No heme or edema Flat, Good foveal reflex, fine Drusen, RPE mottling, No heme or edema   Vessels mild attenuation, mild tortuosity attenuated, mild tortuosity   Periphery Attached, reticular degeneration, No heme Attached; good 360 peripheral laser changes, no RT/RD           IMAGING AND PROCEDURES  Imaging and Procedures for 07/03/2024  OCT, Retina - OU - Both Eyes       Right Eye Quality was good. Central Foveal Thickness: 295. Progression has been stable. Findings include normal foveal contour, no IRF, no SRF, retinal drusen (Partial PVD).   Left Eye Quality was good. Central Foveal Thickness: 310. Progression has improved. Findings include normal foveal contour, no IRF, no SRF, retinal drusen (interval improvement in vit opacities).   Notes *Images captured and stored on drive  Diagnosis / Impression:  NFP, no IRF/SRF OU OS: interval improvement in  vit opacities  Clinical management:  See below  Abbreviations: NFP - Normal foveal profile. CME - cystoid macular edema. PED - pigment epithelial detachment. IRF - intraretinal fluid. SRF - subretinal fluid. EZ - ellipsoid zone. ERM - epiretinal membrane. ORA - outer retinal atrophy. ORT - outer retinal tubulation. SRHM - subretinal hyper-reflective material. IRHM - intraretinal hyper-reflective material           ASSESSMENT/PLAN:   ICD-10-CM   1. Posterior vitreous detachment of left eye  H43.812 OCT, Retina - OU - Both Eyes    2. Vitreous opacities of left eye  H43.392     3. Combined forms of age-related cataract of both eyes  H25.813      1,2. PVD OS -- visually significant vitreous opacities - onset in February 2025 -- persistent prominent symptomatic floater x9 mos without significant improvement  - pt reports significant impact on activities of daily living    - POD1 s/p PPV/OS -12.04.25             - doing well this morning  - exam and OCT show interval improvement in vitreous opacities - start PF 4x/day OS                         zymaxid  QID OS                          Atropine  BID OS                         PSO ung QID OS             - eye shield when sleeping  - post op drop  and positioning (sleep with head elevated) instructions reviewed              - tylenol /ibuprofen for pain              - Rx given for breakthrough pain   - f/u 12.11.25 POV, DFE, OCT  3. Mixed Cataract OU  The symptoms of cataract, surgical options, and treatments and risks were discussed with patient. - discussed diagnosis and progression - monitor  Ophthalmic Meds Ordered this visit:  No orders of the defined types were placed in this encounter.    Return in about 6 days (around 07/09/2024) for f/u POV, DFE, OCT.  There are no Patient Instructions on file for this visit.  This document serves as a record of services personally performed by [name] Hans, MD, PhD. It was created on  their behalf by Delon Newness COT, an ophthalmic technician. The creation of this record is the provider's dictation and/or activities during the visit.    Electronically signed by: Delon Newness COT 12.02.25 9:16 PM  This document serves as a record of services personally performed by [name] Hans, MD, PhD. It was created on their behalf by Wanda GEANNIE Keens, COT an ophthalmic technician. The creation of this record is the provider's dictation and/or activities during the visit.    Electronically signed by:  Wanda GEANNIE Keens, COT  07/05/24 9:16 PM  [name] Hans, M.D., Ph.D. Diseases & Surgery of the Retina and Vitreous [facility] 07/03/2024   I have reviewed the above documentation for accuracy and completeness, and I agree with the above. [name] Hans, M.D., Ph.D. 07/05/24 9:19 PM   Abbreviations: M myopia (nearsighted); A astigmatism; H hyperopia (farsighted); P presbyopia; Mrx spectacle prescription;  CTL contact lenses; OD right eye; OS left eye; OU both eyes  XT exotropia; ET esotropia; PEK punctate epithelial keratitis; PEE punctate epithelial erosions; DES dry eye syndrome; MGD meibomian gland dysfunction; ATs artificial tears; PFAT's preservative free artificial tears; NSC nuclear sclerotic cataract; PSC posterior subcapsular cataract; ERM epi-retinal membrane; PVD posterior vitreous detachment; RD retinal detachment; DM diabetes mellitus; DR diabetic retinopathy; NPDR non-proliferative diabetic retinopathy; PDR proliferative diabetic retinopathy; CSME clinically significant macular edema; DME diabetic macular edema; dbh dot blot hemorrhages; CWS cotton wool spot; POAG primary open angle glaucoma; C/D cup-to-disc ratio; HVF humphrey visual field; GVF goldmann visual field; OCT optical coherence tomography; IOP intraocular pressure; BRVO Branch retinal vein occlusion; CRVO central retinal vein occlusion; CRAO central retinal artery occlusion;  BRAO branch retinal artery occlusion; RT retinal tear; SB scleral buckle; PPV pars plana vitrectomy; VH Vitreous hemorrhage; PRP panretinal laser photocoagulation; IVK intravitreal kenalog ; VMT vitreomacular traction; MH Macular hole;  NVD neovascularization of the disc; NVE neovascularization elsewhere; AREDS age related eye disease study; ARMD age related macular degeneration; POAG primary open angle glaucoma; EBMD epithelial/anterior basement membrane dystrophy; ACIOL anterior chamber intraocular lens; IOL intraocular lens; PCIOL posterior chamber intraocular lens; Phaco/IOL phacoemulsification with intraocular lens placement; PRK photorefractive keratectomy; LASIK laser assisted in situ keratomileusis; HTN hypertension; DM diabetes mellitus; COPD chronic obstructive pulmonary disease

## 2024-06-30 NOTE — H&P (Signed)
 Bobby Gross is an 68 y.o. male.    Chief Complaint: visually significant vitreous opacities, LEFT EYE  HPI: Pt with a 10 mo history of persistent prominent symptomatic floaters OS without significant improvement since onset in Feb 2025. Pt reports significant impact on activities of daily living. We discussed the exam findings, prognosis, and treatment options. Then after extensive discussions about the risks benefits and alternatives to surgery, the patient has elected to proceed with surgical removal of his visually significant vitreous opacities OS via 25g PPV under general anesthesia.   Past Medical History:  Diagnosis Date   B12 deficiency    Following small bowel resection   Cancer (HCC)    skin   Crohn's disease (HCC)    Involving small bowel   History of kidney stones    History of nephrolithiasis    Hyperlipemia    Hypothyroidism     Past Surgical History:  Procedure Laterality Date   BIOPSY  01/31/2017   Procedure: BIOPSY;  Surgeon: Shaaron Lamar HERO, MD;  [facility];  Service: Endoscopy;;  biopsy     BIOPSY  12/20/2021   Procedure: BIOPSY;  Surgeon: Shaaron Lamar HERO, MD;  [facility];  Service: Endoscopy;;  biopsies of anastomosis(stricture)   CERVICAL SPINE SURGERY  11/07/2021   C5/6 disc replacement   COLONOSCOPY  10/03/2006   Normal rectum/Status post right hemicolectomy with a small bowel anastomosis,  fibrotic-appearing anastomosis consistent with Crohn's, status post  biopsy, but overall, things look really good today   COLONOSCOPY  01/28/2012   Dr. Waddell R hemicolectomy for ileocolonic crohn's disease. disease appears inactive at this time.  all Bx's benign.   COLONOSCOPY N/A 01/31/2017   Procedure: COLONOSCOPY;  Surgeon: Shaaron Lamar HERO, MD;  [facility];  Service: Endoscopy;  Laterality: N/A;  7:30am   COLONOSCOPY WITH PROPOFOL  N/A 12/20/2021   Procedure: COLONOSCOPY WITH PROPOFOL ;  Surgeon: Shaaron Lamar HERO, MD;   [facility];  Service: Endoscopy;  Laterality: N/A;  11:15am   DEXA scan  09/2010   LEFT HEART CATH AND CORONARY ANGIOGRAPHY N/A 04/10/2017   Procedure: LEFT HEART CATH AND CORONARY ANGIOGRAPHY;  Surgeon: Claudene Victory ORN, MD;  [facility];  Service: Cardiovascular;  Laterality: N/A;   OPEN SURGICAL REPAIR OF GLUTEAL TENDON Left 05/10/2023   Procedure: Left hip open gluteus medius repair;  Surgeon: Sharl Selinda Dover, MD;  Location: [facility];  Service: Orthopedics;  Laterality: Left;   RIGHT FOOT SURGER  2009   PLANTAR FASCITIS   RIGHT SHOULDER SURGERY     RIGHT WRIST SURGERY     SMALL INTESTINE SURGERY  1988   AT [facility]   TONSILLECTOMY      Family History  Problem Relation Age of Onset   Colon cancer Other    Social History:  reports that he has never smoked. He has never used smokeless tobacco. He reports that he does not drink alcohol  and does not use drugs.  Allergies: Not on File  No medications prior to admission.    Review of systems otherwise negative  There were no vitals taken for this visit.  Physical exam: Mental status: oriented x3. Eyes: See eye exam associated with this date of surgery Ears, Nose, Throat: within normal limits Neck: Within Normal limits General: within normal limits Chest: Within normal limits Breast: deferred Heart: Within normal limits Abdomen: Within normal limits GU: deferred Extremities: within normal limits Skin: within normal limits  Assessment/Plan 1.  Visually significant vitreous opacities, LEFT EYE  Plan: To [facility] for 25g PPV OS under general anesthesia - case scheduled for Thursday, Jul 02, 1129 am -- [facility] OR 08  [name] Hans, M.D., Ph.D. Vitreoretinal Surgeon [facility]

## 2024-07-01 ENCOUNTER — Other Ambulatory Visit: Payer: Self-pay

## 2024-07-01 ENCOUNTER — Encounter (HOSPITAL_COMMUNITY): Payer: Self-pay | Admitting: Ophthalmology

## 2024-07-01 NOTE — Progress Notes (Signed)
 PCP - Dr Elspeth Messier Cardiologist - none [place] - Dr Lamar Hollingshead  Chest x-ray - 06/17/24 [facility] EKG - DOS Stress Test - 2005 ECHO - 03/09/20 [facility] Cardiac Cath - 04/10/17  ICD Pacemaker/Loop - n/a  Sleep Study -  n/a  Diabetes - n/a  Aspirin  & Blood Thinner Instructions:  n/a  ERAS - clear liquids til 0830 DOS.  Anesthesia review: Yes  STOP now taking any Aspirin  (unless otherwise instructed by your surgeon), Aleve, Naproxen, Ibuprofen, Motrin, Advil, Goody's, BC's, all herbal medications, fish oil, and all vitamins.   Coronavirus Screening Do you have any of the following symptoms:  Cough yes/no: No Fever (>100.70F)  yes/no: No Runny nose yes/no: No Sore throat yes/no: No Difficulty breathing/shortness of breath  yes/no: No  Have you traveled in the last 14 days and where? yes/no: No  Patient verbalized understanding of instructions that were given via phone.

## 2024-07-02 ENCOUNTER — Ambulatory Visit (HOSPITAL_COMMUNITY)
Admission: RE | Admit: 2024-07-02 | Discharge: 2024-07-02 | Disposition: A | Attending: Ophthalmology | Admitting: Ophthalmology

## 2024-07-02 ENCOUNTER — Encounter (HOSPITAL_COMMUNITY): Admission: RE | Disposition: A | Payer: Self-pay | Source: Home / Self Care | Attending: Ophthalmology

## 2024-07-02 ENCOUNTER — Ambulatory Visit (HOSPITAL_COMMUNITY): Admitting: Anesthesiology

## 2024-07-02 ENCOUNTER — Encounter (HOSPITAL_COMMUNITY): Payer: Self-pay | Admitting: Ophthalmology

## 2024-07-02 DIAGNOSIS — H43392 Other vitreous opacities, left eye: Secondary | ICD-10-CM | POA: Diagnosis not present

## 2024-07-02 HISTORY — DX: Unspecified osteoarthritis, unspecified site: M19.90

## 2024-07-02 HISTORY — PX: PARS PLANA VITRECTOMY W/ FOCAL ENDOLASER PHOTOCOAGULATION: SHX7339

## 2024-07-02 LAB — BASIC METABOLIC PANEL WITH GFR
Anion gap: 9 (ref 5–15)
BUN: 10 mg/dL (ref 8–23)
CO2: 21 mmol/L — ABNORMAL LOW (ref 22–32)
Calcium: 8.8 mg/dL — ABNORMAL LOW (ref 8.9–10.3)
Chloride: 107 mmol/L (ref 98–111)
Creatinine, Ser: 1.17 mg/dL (ref 0.61–1.24)
GFR, Estimated: >60 mL/min (ref >60)
Glucose, Bld: 97 mg/dL (ref 70–99)
Potassium: 3.6 mmol/L (ref 3.5–5.1)
Sodium: 137 mmol/L (ref 135–145)

## 2024-07-02 SURGERY — PARS PLANA VITRECTOMY WITH 25 GAUGE WITH FOCAL ENDOLASER PHOTOCOAGULATION
Anesthesia: General | Site: Eye | Laterality: Left

## 2024-07-02 MED ORDER — CEFTAZIDIME 1 G IJ SOLR
INTRAMUSCULAR | Status: AC
  Filled 2024-07-02: qty 1

## 2024-07-02 MED ORDER — PHENYLEPHRINE HCL 10 % OP SOLN
1.0000 [drp] | OPHTHALMIC | Status: AC | PRN
  Administered 2024-07-02 (×3): 1 [drp] via OPHTHALMIC
  Filled 2024-07-02: qty 5

## 2024-07-02 MED ORDER — ACETAZOLAMIDE SODIUM 500 MG IJ SOLR
INTRAMUSCULAR | Status: AC
  Filled 2024-07-02: qty 500

## 2024-07-02 MED ORDER — CHLORHEXIDINE GLUCONATE 0.12 % MT SOLN: 15.0000 mL | Freq: Once | OROMUCOSAL | Status: AC

## 2024-07-02 MED ORDER — EPHEDRINE SULFATE-NACL 50-0.9 MG/10ML-% IV SOSY
PREFILLED_SYRINGE | INTRAVENOUS | Status: DC | PRN
  Administered 2024-07-02 (×2): 10 mg via INTRAVENOUS
  Administered 2024-07-02: 5 mg via INTRAVENOUS

## 2024-07-02 MED ORDER — ATROPINE SULFATE 1 % OP SOLN
1.0000 [drp] | OPHTHALMIC | Status: AC | PRN
  Administered 2024-07-02 (×3): 1 [drp] via OPHTHALMIC
  Filled 2024-07-02: qty 2

## 2024-07-02 MED ORDER — LIDOCAINE HCL 1 % IJ SOLN
INTRAMUSCULAR | Status: AC
  Filled 2024-07-02: qty 20

## 2024-07-02 MED ORDER — OXYCODONE HCL 5 MG PO TABS
ORAL_TABLET | ORAL | Status: AC
  Filled 2024-07-02: qty 1

## 2024-07-02 MED ORDER — FENTANYL CITRATE (PF) 100 MCG/2ML IJ SOLN
25.0000 ug | INTRAMUSCULAR | Status: DC | PRN
  Administered 2024-07-02 (×3): 50 ug via INTRAVENOUS

## 2024-07-02 MED ORDER — NA CHONDROIT SULF-NA HYALURON 40-30 MG/ML IO SOSY
INTRAOCULAR | Status: AC
  Filled 2024-07-02: qty 1

## 2024-07-02 MED ORDER — STERILE WATER FOR INJECTION IJ SOLN
INTRAMUSCULAR | Status: AC
  Filled 2024-07-02: qty 10

## 2024-07-02 MED ORDER — ONDANSETRON HCL 4 MG/2ML IJ SOLN: 4.0000 mg | Freq: Once | INTRAMUSCULAR | Status: DC | PRN

## 2024-07-02 MED ORDER — CARBACHOL 0.01 % IO SOLN
INTRAOCULAR | Status: AC
  Filled 2024-07-02: qty 1.5

## 2024-07-02 MED ORDER — NA CHONDROIT SULF-NA HYALURON 40-30 MG/ML IO SOSY
INTRAOCULAR | Status: DC | PRN
  Administered 2024-07-02: .5 mL via INTRAOCULAR

## 2024-07-02 MED ORDER — ROCURONIUM BROMIDE 10 MG/ML (PF) SYRINGE
PREFILLED_SYRINGE | INTRAVENOUS | Status: DC | PRN
  Administered 2024-07-02: 60 mg via INTRAVENOUS

## 2024-07-02 MED ORDER — EPINEPHRINE PF 1 MG/ML IJ SOLN
INTRAOCULAR | Status: DC | PRN
  Administered 2024-07-02: 500.3 mL

## 2024-07-02 MED ORDER — DEXAMETHASONE SOD PHOSPHATE PF 10 MG/ML IJ SOLN
INTRAMUSCULAR | Status: DC | PRN
  Administered 2024-07-02: 5 mg via INTRAVENOUS

## 2024-07-02 MED ORDER — ACETAMINOPHEN 10 MG/ML IV SOLN
1000.0000 mg | Freq: Once | INTRAVENOUS | Status: DC | PRN
  Administered 2024-07-02: 1000 mg via INTRAVENOUS

## 2024-07-02 MED ORDER — POLYMYXIN B SULFATE 500000 UNITS IJ SOLR
INTRAMUSCULAR | Status: AC
  Filled 2024-07-02: qty 10

## 2024-07-02 MED ORDER — PROPARACAINE HCL 0.5 % OP SOLN
1.0000 [drp] | OPHTHALMIC | Status: AC | PRN
  Administered 2024-07-02 (×3): 1 [drp] via OPHTHALMIC
  Filled 2024-07-02: qty 15

## 2024-07-02 MED ORDER — ACETAMINOPHEN 10 MG/ML IV SOLN
INTRAVENOUS | Status: AC
  Filled 2024-07-02: qty 100

## 2024-07-02 MED ORDER — OXYCODONE HCL 5 MG PO TABS
5.0000 mg | ORAL_TABLET | Freq: Once | ORAL | Status: AC | PRN
  Administered 2024-07-02: 5 mg via ORAL

## 2024-07-02 MED ORDER — TRIAMCINOLONE ACETONIDE 40 MG/ML IJ SUSP
INTRAMUSCULAR | Status: DC | PRN
  Administered 2024-07-02: 1.5 mL

## 2024-07-02 MED ORDER — BRIMONIDINE TARTRATE 0.2 % OP SOLN
OPHTHALMIC | Status: DC | PRN
  Administered 2024-07-02: 1 [drp] via OPHTHALMIC

## 2024-07-02 MED ORDER — SODIUM CHLORIDE (PF) 0.9 % IJ SOLN
INTRAMUSCULAR | Status: AC
  Filled 2024-07-02: qty 10

## 2024-07-02 MED ORDER — FENTANYL CITRATE (PF) 250 MCG/5ML IJ SOLN
INTRAMUSCULAR | Status: DC | PRN
  Administered 2024-07-02: 100 ug via INTRAVENOUS

## 2024-07-02 MED ORDER — GATIFLOXACIN 0.5 % OP SOLN
OPHTHALMIC | Status: AC
  Filled 2024-07-02: qty 2.5

## 2024-07-02 MED ORDER — STERILE WATER FOR INJECTION IJ SOLN
INTRAMUSCULAR | Status: DC | PRN
  Administered 2024-07-02: 1 mL

## 2024-07-02 MED ORDER — BRIMONIDINE TARTRATE 0.2 % OP SOLN
OPHTHALMIC | Status: AC
  Filled 2024-07-02: qty 5

## 2024-07-02 MED ORDER — PREDNISOLONE ACETATE 1 % OP SUSP
OPHTHALMIC | Status: AC
  Filled 2024-07-02: qty 5

## 2024-07-02 MED ORDER — PREDNISOLONE ACETATE 1 % OP SUSP
OPHTHALMIC | Status: DC | PRN
  Administered 2024-07-02: 1 [drp] via OPHTHALMIC

## 2024-07-02 MED ORDER — OXYCODONE HCL 5 MG/5ML PO SOLN: 5.0000 mg | Freq: Once | ORAL | Status: AC | PRN

## 2024-07-02 MED ORDER — LIDOCAINE 2% (20 MG/ML) 5 ML SYRINGE
INTRAMUSCULAR | Status: DC | PRN
  Administered 2024-07-02: 100 mg via INTRAVENOUS

## 2024-07-02 MED ORDER — BACITRACIN-POLYMYXIN B 500-10000 UNIT/GM OP OINT
TOPICAL_OINTMENT | OPHTHALMIC | Status: AC
  Filled 2024-07-02: qty 3.5

## 2024-07-02 MED ORDER — MIDAZOLAM HCL (PF) 2 MG/2ML IJ SOLN
INTRAMUSCULAR | Status: DC | PRN
  Administered 2024-07-02: 2 mg via INTRAVENOUS

## 2024-07-02 MED ORDER — TROPICAMIDE 1 % OP SOLN
1.0000 [drp] | OPHTHALMIC | Status: AC | PRN
  Administered 2024-07-02 (×3): 1 [drp] via OPHTHALMIC
  Filled 2024-07-02: qty 15

## 2024-07-02 MED ORDER — LACTATED RINGERS IV SOLN: INTRAVENOUS | Status: DC

## 2024-07-02 MED ORDER — PROPOFOL 10 MG/ML IV BOLUS
INTRAVENOUS | Status: DC | PRN
  Administered 2024-07-02: 120 mg via INTRAVENOUS

## 2024-07-02 MED ORDER — SODIUM CHLORIDE 0.9 % IV SOLN: INTRAVENOUS | Status: DC

## 2024-07-02 MED ORDER — EPINEPHRINE PF 1 MG/ML IJ SOLN
INTRAMUSCULAR | Status: AC
  Filled 2024-07-02: qty 1

## 2024-07-02 MED ORDER — PROPOFOL 10 MG/ML IV BOLUS
INTRAVENOUS | Status: AC
  Filled 2024-07-02: qty 20

## 2024-07-02 MED ORDER — MIDAZOLAM HCL 2 MG/2ML IJ SOLN
INTRAMUSCULAR | Status: AC
  Filled 2024-07-02: qty 2

## 2024-07-02 MED ORDER — FENTANYL CITRATE (PF) 100 MCG/2ML IJ SOLN
INTRAMUSCULAR | Status: AC
  Filled 2024-07-02: qty 2

## 2024-07-02 MED ORDER — CHLORHEXIDINE GLUCONATE 0.12 % MT SOLN
OROMUCOSAL | Status: AC
  Administered 2024-07-02: 15 mL via OROMUCOSAL
  Filled 2024-07-02: qty 15

## 2024-07-02 MED ORDER — ATROPINE SULFATE 1 % OP SOLN
OPHTHALMIC | Status: AC
  Filled 2024-07-02: qty 5

## 2024-07-02 MED ORDER — BSS PLUS IO SOLN
INTRAOCULAR | Status: AC
  Filled 2024-07-02: qty 500

## 2024-07-02 MED ORDER — BSS IO SOLN
INTRAOCULAR | Status: AC
  Filled 2024-07-02: qty 15

## 2024-07-02 MED ORDER — ORAL CARE MOUTH RINSE: 15.0000 mL | Freq: Once | OROMUCOSAL | Status: AC

## 2024-07-02 MED ORDER — DORZOLAMIDE HCL-TIMOLOL MAL 2-0.5 % OP SOLN
OPHTHALMIC | Status: AC
  Filled 2024-07-02: qty 10

## 2024-07-02 MED ORDER — DORZOLAMIDE HCL-TIMOLOL MAL 2-0.5 % OP SOLN
OPHTHALMIC | Status: DC | PRN
  Administered 2024-07-02: 1 [drp] via OPHTHALMIC

## 2024-07-02 MED ORDER — BUPIVACAINE HCL (PF) 0.75 % IJ SOLN
INTRAMUSCULAR | Status: AC
  Filled 2024-07-02: qty 10

## 2024-07-02 MED ORDER — TRIAMCINOLONE ACETONIDE 40 MG/ML IJ SUSP
INTRAMUSCULAR | Status: AC
  Filled 2024-07-02: qty 5

## 2024-07-02 MED ORDER — BACITRACIN-POLYMYXIN B 500-10000 UNIT/GM OP OINT
TOPICAL_OINTMENT | OPHTHALMIC | Status: DC | PRN
  Administered 2024-07-02: 1 via OPHTHALMIC

## 2024-07-02 MED ORDER — BSS IO SOLN
INTRAOCULAR | Status: DC | PRN
  Administered 2024-07-02: 15 mL

## 2024-07-02 MED ORDER — GATIFLOXACIN 0.5 % OP SOLN
OPHTHALMIC | Status: DC | PRN
  Administered 2024-07-02: 1 [drp] via OPHTHALMIC

## 2024-07-02 SURGICAL SUPPLY — 45 items
APPLICATOR COTTON TIP 6 STRL (MISCELLANEOUS) ×8 IMPLANT
BAND WRIST GAS GREEN (MISCELLANEOUS) IMPLANT
BNDG EYE OVAL 2 1/8 X 2 5/8 (GAUZE/BANDAGES/DRESSINGS) ×2 IMPLANT
CABLE BIPOLOR RESECTION CORD (MISCELLANEOUS) ×2 IMPLANT
CANNULA ANT CHAM MAIN (OPHTHALMIC RELATED) IMPLANT
CANNULA DUALBORE 25G (CANNULA) ×2 IMPLANT
CANNULA FLEX TIP 25G (CANNULA) ×2 IMPLANT
CLSR STERI-STRIP ANTIMIC 1/2X4 (GAUZE/BANDAGES/DRESSINGS) ×2 IMPLANT
DRAPE INCISE 51X51 W/FILM STRL (DRAPES) ×2 IMPLANT
DRAPE MICROSCOPE LEICA 46X105 (MISCELLANEOUS) ×2 IMPLANT
DRAPE OPHTHALMIC 77X100 STRL (CUSTOM PROCEDURE TRAY) ×2 IMPLANT
FILTER STRAW FLUID ASPIR (MISCELLANEOUS) IMPLANT
FORCEPS GRIESHABER ILM 25G A (INSTRUMENTS) IMPLANT
FORCEPS GRIESHABER MAX 25G (MISCELLANEOUS) IMPLANT
GAS AUTO FILL CONSTELLATION (OPHTHALMIC) IMPLANT
GLOVE BIO SURGEON STRL SZ7 (GLOVE) ×2 IMPLANT
GLOVE BIO SURGEON STRL SZ7.5 (GLOVE) ×2 IMPLANT
GLOVE BIOGEL M 7.0 STRL (GLOVE) ×2 IMPLANT
GOWN STRL REUS W/ TWL LRG LVL3 (GOWN DISPOSABLE) ×6 IMPLANT
KIT BASIN OR (CUSTOM PROCEDURE TRAY) ×2 IMPLANT
KIT PERFLUORON PROCEDURE 5ML (MISCELLANEOUS) IMPLANT
LENS VITRECTOMY FLAT OCLR DISP (MISCELLANEOUS) IMPLANT
LOOP FINESSE 25 GA (MISCELLANEOUS) IMPLANT
NDL 18GX1X1/2 (RX/OR ONLY) (NEEDLE) ×4 IMPLANT
NDL 25GX 5/8IN NON SAFETY (NEEDLE) ×2 IMPLANT
NDL FILTER BLUNT 18X1 1/2 (NEEDLE) ×2 IMPLANT
NDL HYPO 30X.5 LL (NEEDLE) ×4 IMPLANT
PACK VITRECTOMY CUSTOM (CUSTOM PROCEDURE TRAY) ×2 IMPLANT
PAD ARMBOARD POSITIONER FOAM (MISCELLANEOUS) ×4 IMPLANT
PAK PIK VITRECTOMY CVS 25GA (OPHTHALMIC) ×2 IMPLANT
PENCIL BIPOLAR 25GA STR DISP (OPHTHALMIC RELATED) IMPLANT
PROBE ENDO DIATHERMY 25G (MISCELLANEOUS) IMPLANT
PROBE LASER ILLUM FLEX CVD 25G (OPHTHALMIC) IMPLANT
REPL STRA BRUSH NDL (NEEDLE) IMPLANT
RESERVOIR BACK FLUSH (MISCELLANEOUS) IMPLANT
RETRACTOR IRIS FLEX 25G GRIESH (INSTRUMENTS) IMPLANT
SCISSORS TIP ADVANCED DSP 25GA (INSTRUMENTS) IMPLANT
SET INJECTOR OIL FLUID CONSTEL (OPHTHALMIC) IMPLANT
SHIELD EYE LENSE ONLY DISP (GAUZE/BANDAGES/DRESSINGS) IMPLANT
SOLN STERILE WATER BTL 1000 ML (IV SOLUTION) ×2 IMPLANT
SUT VICRYL 7 0 TG140 8 (SUTURE) ×2 IMPLANT
SYR 10ML LL (SYRINGE) ×4 IMPLANT
SYR TB 1ML LUER SLIP (SYRINGE) ×4 IMPLANT
TOWEL GREEN STERILE FF (TOWEL DISPOSABLE) ×2 IMPLANT
TUBING HIGH PRESS EXTEN 6IN (TUBING) ×2 IMPLANT

## 2024-07-02 NOTE — Anesthesia Postprocedure Evaluation (Signed)
 Anesthesia Post Note  Patient: OTHELL JAIME  Procedure(s) Performed: PARS PLANA VITRECTOMY WITH 25 GAUGE WITH FOCAL ENDOLASER PHOTOCOAGULATION (Left: Eye)     Patient location during evaluation: PACU Anesthesia Type: General Level of consciousness: awake and alert Pain management: pain level controlled Vital Signs Assessment: post-procedure vital signs reviewed and stable Respiratory status: spontaneous breathing, nonlabored ventilation, respiratory function stable and patient connected to nasal cannula oxygen  Cardiovascular status: blood pressure returned to baseline and stable Postop Assessment: no apparent nausea or vomiting Anesthetic complications: no   No notable events documented.  Last Vitals:  Vitals:   07/02/24 1400 07/02/24 1415  BP: (!) 154/83 (!) 142/79  Pulse: 66 67  Resp: 15 13  Temp:  36.7 C  SpO2: 96% 96%    Last Pain:  Vitals:   07/02/24 1403  TempSrc:   PainSc: 3                  Lynwood MARLA Cornea

## 2024-07-02 NOTE — Anesthesia Preprocedure Evaluation (Addendum)
 Anesthesia Evaluation  Patient identified by MRN, date of birth, ID band Patient awake    Reviewed: Allergy & Precautions, NPO status , Patient's Chart, lab work & pertinent test results, reviewed documented beta blocker date and time   History of Anesthesia Complications Negative for: history of anesthetic complications  Airway Mallampati: II  TM Distance: >3 FB     Dental no notable dental hx.    Pulmonary neg COPD   breath sounds clear to auscultation       Cardiovascular + angina  (-) Past MI, (-) Cardiac Stents, (-) CABG and (-) Peripheral Vascular Disease  Rhythm:Regular Rate:Normal     Neuro/Psych  Headaches, neg Seizures    GI/Hepatic ,neg GERD  ,,(+) neg Cirrhosis        Endo/Other  Hypothyroidism    Renal/GU Renal disease     Musculoskeletal  (+) Arthritis , Osteoarthritis,    Abdominal   Peds  Hematology   Anesthesia Other Findings   Reproductive/Obstetrics                              Anesthesia Physical Anesthesia Plan  ASA: 2  Anesthesia Plan: General   Post-op Pain Management:    Induction: Intravenous  PONV Risk Score and Plan: 2 and Ondansetron  and Dexamethasone   Airway Management Planned: Oral ETT  Additional Equipment:   Intra-op Plan:   Post-operative Plan: Extubation in OR  Informed Consent: I have reviewed the patients History and Physical, chart, labs and discussed the procedure including the risks, benefits and alternatives for the proposed anesthesia with the patient or authorized representative who has indicated his/her understanding and acceptance.     Dental advisory given  Plan Discussed with: CRNA  Anesthesia Plan Comments:          Anesthesia Quick Evaluation

## 2024-07-02 NOTE — Anesthesia Procedure Notes (Signed)
 Procedure Name: Intubation Date/Time: 07/02/2024 11:50 AM  Performed by: Evette Ade, CRNAPre-anesthesia Checklist: Patient identified, Emergency Drugs available, Suction available, Patient being monitored and Timeout performed Patient Re-evaluated:Patient Re-evaluated prior to induction Oxygen  Delivery Method: Circle system utilized Preoxygenation: Pre-oxygenation with 100% oxygen  Induction Type: IV induction Ventilation: Mask ventilation without difficulty and Oral airway inserted - appropriate to patient size Laryngoscope Size: Mac and 4 Grade View: Grade II Tube type: Oral Tube size: 7.5 mm Number of attempts: 1 Airway Equipment and Method: Stylet Placement Confirmation: ETT inserted through vocal cords under direct vision, positive ETCO2 and breath sounds checked- equal and bilateral Secured at: 21 cm Tube secured with: Tape Dental Injury: Teeth and Oropharynx as per pre-operative assessment

## 2024-07-02 NOTE — Interval H&P Note (Signed)
 History and Physical Interval Note:  07/02/2024 11:33 AM  Bobby Gross  has presented today for surgery, with the diagnosis of Vitreous floater, left eye.  The various methods of treatment have been discussed with the patient and family. After consideration of risks, benefits and other options for treatment, the patient has consented to  Procedure(s): PARS PLANA VITRECTOMY 25 GAUGE FOR ENDOPHTHALMITIS (Left) as a surgical intervention.  The patient's history has been reviewed, patient examined, no change in status, stable for surgery.  I have reviewed the patient's chart and labs.  Questions were answered to the patient's satisfaction.     Bobby Gross

## 2024-07-02 NOTE — Op Note (Signed)
 Date of procedure: 12.04.25   Surgeon: [name], M.D., Ph.D    Assistant: Almetta Pesa, Ophthalmic Assistant    Pre-operative Diagnosis:  1. Visually significant vitreous opacities, LEFT EYE   Post-operative diagnosis:  1. Visually significant vitreous opacities, LEFT EYE   Anesthesia: General   Procedure: 1)     25 gauge pars plana vitrectomy w/ endolaser, LEFT EYE   Complications: none Estimated blood loss: minimal Specimens: none   Brief history:  Pt has a history of visually significant vitreous opacities of the LEFT eye, which were affecting his activities of daily living. The risks, benefits, and alternatives were explained to the patient, including pain, bleeding, infection, loss of vision, double vision, droopy eyelids, and need for more surgeries.  Informed consent was obtained from the patient and placed in the chart.     Procedure: The patient was brought to the preoperative holding area where the correct eye was confirmed and marked. The patient was then brought to the operating room where general anesthesia was induced by the Anesthesia team. A secondary time-out was performed to identify the correct patient, eye, procedure, and any allergies. The eye was prepped and draped in the usual sterile ophthalmic fashion followed by placement of a lid speculum.    A 25 gauge trocar was placed in the inferotemporal quadrant 4 mm posterior to the limbus in a beveled fashion. A 4 mm infusion cannula was placed through this trocar, and the infusion cannula was confirmed in the vitreous cavity with no incarceration of retina or choroid prior to turning it on. Two additional 25 gauge trocars were placed in the superonasal and superotemporal quadrants in a similar beveled fashion.   At this time, a standard three-port pars plana vitrectomy was performed using the light pipe, the cutter, and the BIOM viewing system. A thorough core and peripheral vitreous dissection was performed.  Kenalog was used to highlight the vitreous. Of note, there were significant vitreous opacities within relatively thick and cloudy vitreous. Care was taken to insure the posterior vitreous was detached. Peripheral vitrectomy was completed with care using scleral depression. On scleral depressed exam there were no retinal breaks or tears or detachments.Endolaser was used to place laser 360 degrees around the periphery for prophylaxis.   The trocars were then removed and sutured with 7-0 vicryl in an interrupted fashion. Subconjunctival injections of antibiotic and Kenalog were administered. The lid speculum and drapes were removed. Drops of an antibiotic and steroid were given. The eye was patched and shielded. The patient tolerated the procedure well without any intraoperative or immediate postoperative complications. The patient was taken to the recovery room in good condition. The patient was instructed to follow-up with Dr. Hans in clinic on the following morning.

## 2024-07-02 NOTE — Transfer of Care (Signed)
 Immediate Anesthesia Transfer of Care Note  Patient: Bobby Gross  Procedure(s) Performed: PARS PLANA VITRECTOMY WITH 25 GAUGE WITH FOCAL ENDOLASER PHOTOCOAGULATION (Left: Eye)  Patient Location: PACU  Anesthesia Type:General  Level of Consciousness: awake, alert , and oriented  Airway & Oxygen  Therapy: Patient Spontanous Breathing  Post-op Assessment: Report given to RN and Post -op Vital signs reviewed and stable  Post vital signs: Reviewed and stable  Last Vitals:  Vitals Value Taken Time  BP 147/78 07/02/24 13:30  Temp    Pulse 74 07/02/24 13:36  Resp 18 07/02/24 13:36  SpO2 96 % 07/02/24 13:36  Vitals shown include unfiled device data.  Last Pain:  Vitals:   07/02/24 1335  TempSrc:   PainSc: 6          Complications: No notable events documented.

## 2024-07-02 NOTE — Discharge Instructions (Addendum)
POSTOPERATIVE INSTRUCTIONS  Your doctor has performed vitreoretinal surgery on you at [facility]. [place] eye patched and shielded until seen by Dr. Coralyn Pear 8 AM tomorrow in clinic - Do not use drops until return - Sleep with head elevated 30-45 degrees   - No strenuous bending, stooping or lifting.  - You may not drive until further notice.  - Tylenol or any other over-the-counter pain reliever can be used according to your doctor. If more pain medicine is required, your doctor will have a prescription for you.  - You may read, go up and down stairs, and watch television.     Bernarda Caffey, M.D., Ph.D.

## 2024-07-02 NOTE — Brief Op Note (Signed)
 07/02/2024  1:22 PM  PATIENT:  Bobby Gross  68 y.o. male  PRE-OPERATIVE DIAGNOSIS:  Vitreous floater, left eye  POST-OPERATIVE DIAGNOSIS:  Vitreous floater, left eye  PROCEDURE:  Procedure(s): PARS PLANA VITRECTOMY WITH 25 GAUGE WITH FOCAL ENDOLASER PHOTOCOAGULATION (Left)  SURGEON:  Surgeons and Role:    * Valdemar Rogue, MD - Primary  ASSISTANTS: Almetta Pesa, Ophthalmic Assistant    ANESTHESIA:   general  EBL:  minimal   BLOOD ADMINISTERED:none  DRAINS: none   LOCAL MEDICATIONS USED:  NONE  SPECIMEN:  No Specimen  DISPOSITION OF SPECIMEN:  N/A  COUNTS:  YES  TOURNIQUET:  * No tourniquets in log *  DICTATION: .Note written in EPIC  PLAN OF CARE: Discharge to home after PACU  PATIENT DISPOSITION:  PACU - hemodynamically stable.   Delay start of Pharmacological VTE agent (>24hrs) due to surgical blood loss or risk of bleeding: not applicable

## 2024-07-03 ENCOUNTER — Encounter (HOSPITAL_COMMUNITY): Payer: Self-pay | Admitting: Ophthalmology

## 2024-07-03 ENCOUNTER — Ambulatory Visit (INDEPENDENT_AMBULATORY_CARE_PROVIDER_SITE_OTHER): Admitting: Ophthalmology

## 2024-07-03 DIAGNOSIS — H43812 Vitreous degeneration, left eye: Secondary | ICD-10-CM

## 2024-07-03 DIAGNOSIS — H43392 Other vitreous opacities, left eye: Secondary | ICD-10-CM | POA: Diagnosis not present

## 2024-07-03 DIAGNOSIS — H25813 Combined forms of age-related cataract, bilateral: Secondary | ICD-10-CM

## 2024-07-08 NOTE — Progress Notes (Signed)
 [facility] - Clinic Note  07/09/2024   CHIEF COMPLAINT Patient presents for Retina Follow Up  HISTORY OF PRESENT ILLNESS: Bobby Gross is a 68 y.o. male who presents to the clinic today for:  HPI     Retina Follow Up   Patient presents with  Other.  In left eye.  This started 6 days ago.  I, the attending physician,  performed the HPI with the patient and updated documentation appropriately.        Comments   Patient here for 6 days retina follow up for POV OS. Patient states vision is alright. No eye pain. Using drops. Didn't use this am.      Last edited by Valdemar Rogue, MD on 07/12/2024  6:30 PM.     Pt states no issues, compliant w/ drops.   Referring physician: Lari Elspeth BRAVO, MD [address] [place],  [place] [address]  HISTORICAL INFORMATION:  Selected notes from the MEDICAL RECORD NUMBER Referred by Dr. Juli for PVD w/ visually significant vitreous opacities LEE:  Ocular Hx- PMH-   CURRENT MEDICATIONS: Current Outpatient Medications (Ophthalmic Drugs)  Medication Sig   bacitracin -polymyxin b  (POLYSPORIN ) ophthalmic ointment Place into the left eye at bedtime for 10 days. Place a 1/2 inch ribbon of ointment into the lower eyelid.   Naphazoline-Pheniramine (OPCON-A) 0.027-0.315 % SOLN Place 1 drop into both eyes daily as needed (Redness).   prednisoLONE  acetate (PRED FORTE ) 1 % ophthalmic suspension Place 1 drop into the left eye in the morning, at noon, and at bedtime. Begin taper-3x/day for 1 week, 2x/day for 1 week, once daily for 1 week then stop   No current facility-administered medications for this visit. (Ophthalmic Drugs)   Current Outpatient Medications (Other)  Medication Sig   cetirizine (ZYRTEC) 10 MG tablet Take 10 mg by mouth daily as needed for allergies.   cyanocobalamin  (VITAMIN B12) 1000 MCG/ML injection INJECT 1 ML INTRAMUSCULARLY  EVERY 30 DAYS (DISCARD 28 DAYS  AFTER FIRST USE)   doxazosin (CARDURA) 4 MG tablet Take  4 mg by mouth every evening.   levothyroxine (SYNTHROID) 100 MCG tablet Take 100 mcg by mouth daily before breakfast.   mesalamine  (LIALDA ) 1.2 g EC tablet TAKE 4 TABLETS BY MOUTH DAILY (Patient taking differently: Take 1.2 g by mouth 2 (two) times daily.)   Multiple Vitamin (MULTIVITAMIN WITH MINERALS) TABS Take 1 tablet by mouth in the morning.   Omega-3 Fatty Acids (OMEGA-3 EPA FISH OIL PO) Take 1,000 mg by mouth daily with breakfast.   TURMERIC PO Take 2 capsules by mouth daily with supper.   No current facility-administered medications for this visit. (Other)   REVIEW OF SYSTEMS: ROS   Positive for: Gastrointestinal, Musculoskeletal, Eyes, Allergic/Imm Negative for: Constitutional, Neurological, Skin, Genitourinary, HENT, Endocrine, Cardiovascular, Respiratory, Psychiatric, Heme/Lymph Last edited by Orval Asberry RAMAN, COA on 07/09/2024  8:10 AM.      ALLERGIES No Known Allergies PAST MEDICAL HISTORY Past Medical History:  Diagnosis Date   Aortic valve sclerosis 03/09/2020   hx - managed by PCP   Arthritis    no meds   B12 deficiency    Following small bowel resection   Cancer (HCC)    skin - back of neck   Crohn's disease (HCC)    Involving small bowel   History of kidney stones    passed stones and surgery for 1 [place]   Hyperlipemia    Hypothyroidism    Past Surgical History:  Procedure  Laterality Date   BIOPSY  01/31/2017   Procedure: BIOPSY;  Surgeon: Shaaron Lamar HERO, MD;  Location: [facility];  Service: Endoscopy;;  biopsy     BIOPSY  12/20/2021   Procedure: BIOPSY;  Surgeon: Shaaron Lamar HERO, MD;  Location: [facility];  Service: Endoscopy;;  biopsies of anastomosis(stricture)   CERVICAL SPINE SURGERY  11/07/2021   C5/6 disc replacement   COLONOSCOPY  10/03/2006   Normal rectum/Status post right hemicolectomy with a small bowel anastomosis,  fibrotic-appearing anastomosis consistent with Crohn's, status post  biopsy, but overall, things look really good  today   COLONOSCOPY  01/28/2012   Dr. Waddell R hemicolectomy for ileocolonic crohn's disease. disease appears inactive at this time.  all Bx's benign.   COLONOSCOPY N/A 01/31/2017   Procedure: COLONOSCOPY;  Surgeon: Shaaron Lamar HERO, MD;  Location: [facility];  Service: Endoscopy;  Laterality: N/A;  7:30am   COLONOSCOPY WITH PROPOFOL  N/A 12/20/2021   Procedure: COLONOSCOPY WITH PROPOFOL ;  Surgeon: Shaaron Lamar HERO, MD;  Location: [facility];  Service: Endoscopy;  Laterality: N/A;  11:15am   DEXA scan  09/2010   LEFT HEART CATH AND CORONARY ANGIOGRAPHY N/A 04/10/2017   Procedure: LEFT HEART CATH AND CORONARY ANGIOGRAPHY;  Surgeon: Claudene Victory ORN, MD;  Location: [facility];  Service: Cardiovascular;  Laterality: N/A;   OPEN SURGICAL REPAIR OF GLUTEAL TENDON Left 05/10/2023   Procedure: Left hip open gluteus medius repair;  Surgeon: Sharl Selinda Dover, MD;  Location: [facility];  Service: Orthopedics;  Laterality: Left;   PARS PLANA VITRECTOMY W/ FOCAL ENDOLASER PHOTOCOAGULATION Left 07/02/2024   Procedure: PARS PLANA VITRECTOMY WITH 25 GAUGE WITH FOCAL ENDOLASER PHOTOCOAGULATION;  Surgeon: Valdemar Rogue, MD;  Location: [facility];  Service: Ophthalmology;  Laterality: Left;   RIGHT FOOT SURGER  2009   PLANTAR FASCITIS   RIGHT SHOULDER SURGERY     RIGHT WRIST SURGERY     SMALL INTESTINE SURGERY  1988   AT [facility]   TONSILLECTOMY     FAMILY HISTORY Family History  Problem Relation Age of Onset   Colon cancer Other    SOCIAL HISTORY Social History   Tobacco Use   Smoking status: Never   Smokeless tobacco: Never   Tobacco comments:    Never smoker  Vaping Use   Vaping status: Never Used  Substance Use Topics   Alcohol  use: No   Drug use: No       OPHTHALMIC EXAM:  Base Eye Exam     Visual Acuity (Snellen - Linear)       Right Left   Dist cc 20/20 -1 20/30 +1   Dist ph cc  NI    Correction: Glasses         Tonometry (Tonopen, 8:38 AM)       Right Left    Pressure 20 17         Pupils       Dark Light Shape React APD   Right 3 2 Round Brisk None   Left dilated             Visual Fields (Counting fingers)       Left Right    Full Full         Extraocular Movement       Right Left    Full, Ortho Full, Ortho         Neuro/Psych     Oriented x3: Yes   Mood/Affect: Normal  Dilation     Left eye: 1.0% Mydriacyl , 2.5% Phenylephrine  @ 8:07 AM  Dilated OS only.          Slit Lamp and Fundus Exam     Slit Lamp Exam       Right Left   Lids/Lashes  Dermatochalasis - upper lid, Meibomian gland dysfunction   Conjunctiva/Sclera  Mild nasal and temporal pinguecula, sub conj heme improving, sutures intact   Cornea  Trace fine PEE inferiorly   Anterior Chamber  deep and clear   Iris  Round and dilated   Lens  2+ Nuclear sclerosis, 2+ Cortical cataract, fine pigment on anterior capsule   Anterior Vitreous  Post vitrectomy, trace fine pigment, stable improvement in vit opacities         Fundus Exam       Right Left   Disc  Pink and Sharp   C/D Ratio  0.5   Macula  Flat, Good foveal reflex, fine Drusen, RPE mottling, No heme or edema   Vessels  attenuated, mild tortuosity   Periphery  Attached; good 360 peripheral laser changes, no RT/RD           Refraction     Wearing Rx       Sphere Cylinder Axis   Right +0.75 +0.50 035   Left -1.50 +2.25 002    Type: SVL           IMAGING AND PROCEDURES  Imaging and Procedures for 07/09/2024  OCT, Retina - OU - Both Eyes       Right Eye Quality was good. Central Foveal Thickness: 294. Progression has been stable. Findings include normal foveal contour, no IRF, no SRF, retinal drusen (Partial PVD).   Left Eye Quality was good. Central Foveal Thickness: 312. Progression has been stable. Findings include normal foveal contour, no IRF, no SRF, retinal drusen (Stable improvement in vit opacities).   Notes *Images captured and stored on  drive  Diagnosis / Impression:  NFP, no IRF/SRF OU OS: Stable improvement in vit opacities  Clinical management:  See below  Abbreviations: NFP - Normal foveal profile. CME - cystoid macular edema. PED - pigment epithelial detachment. IRF - intraretinal fluid. SRF - subretinal fluid. EZ - ellipsoid zone. ERM - epiretinal membrane. ORA - outer retinal atrophy. ORT - outer retinal tubulation. SRHM - subretinal hyper-reflective material. IRHM - intraretinal hyper-reflective material           ASSESSMENT/PLAN:   ICD-10-CM   1. Posterior vitreous detachment of left eye  H43.812 OCT, Retina - OU - Both Eyes    2. Vitreous opacities of left eye  H43.392     3. Combined forms of age-related cataract of both eyes  H25.813      1,2. PVD OS -- visually significant vitreous opacities - onset in February 2025 -- persistent prominent symptomatic floater x9 mos without significant improvement  - pt reported significant impact on activities of daily living    - POW1 s/p PPV/OS -12.04.25             - doing well  - exam and OCT show stable improvement in vitreous opacities - continue  PF 4x/day OS--taper TID x 1 week, BID x 1 week then daily x 1 week then STOP                         zymaxid  QID OS--d/c Sunday 12.14.25  Atropine  BID OS--ok to stop today                          PSO ung QID OS--decrease to at bedtime/PRN - post op drop sheet given and updated 12.11.25 - positioning instructions reviewed--no longer need to sleep upright, use eye shield one more week             - tylenol /ibuprofen for pain   - f/u 4 weeks POV, DFE, OCT  3. Mixed Cataract OU  The symptoms of cataract, surgical options, and treatments and risks were discussed with patient. - discussed diagnosis and progression - monitor  Ophthalmic Meds Ordered this visit:  Meds ordered this encounter  Medications   prednisoLONE  acetate (PRED FORTE ) 1 % ophthalmic suspension    Sig: Place 1 drop  into the left eye in the morning, at noon, and at bedtime. Begin taper-3x/day for 1 week, 2x/day for 1 week, once daily for 1 week then stop    Dispense:  10 mL    Refill:  0   bacitracin -polymyxin b  (POLYSPORIN ) ophthalmic ointment    Sig: Place into the left eye at bedtime for 10 days. Place a 1/2 inch ribbon of ointment into the lower eyelid.    Dispense:  3.5 g    Refill:  1     Return in about 4 weeks (around 08/06/2024) for POV PVD OS, DFE, OCT.  There are no Patient Instructions on file for this visit.  This document serves as a record of services personally performed by [name] Hans, MD, PhD. It was created on their behalf by Auston Muzzy, COMT. The creation of this record is the provider's dictation and/or activities during the visit.  Electronically signed by: Auston Muzzy, COMT 07/12/2024 6:36 PM  This document serves as a record of services personally performed by [name] Hans, MD, PhD. It was created on their behalf by Almetta Pesa, an ophthalmic technician. The creation of this record is the provider's dictation and/or activities during the visit.    Electronically signed by: Almetta Pesa, OA, 07/12/2024  6:36 PM  [name] Hans, M.D., Ph.D. Diseases & Surgery of the Retina and Vitreous [facility]  I have reviewed the above documentation for accuracy and completeness, and I agree with the above. [name] Hans, M.D., Ph.D. 07/12/2024 6:38 PM   Abbreviations: M myopia (nearsighted); A astigmatism; H hyperopia (farsighted); P presbyopia; Mrx spectacle prescription;  CTL contact lenses; OD right eye; OS left eye; OU both eyes  XT exotropia; ET esotropia; PEK punctate epithelial keratitis; PEE punctate epithelial erosions; DES dry eye syndrome; MGD meibomian gland dysfunction; ATs artificial tears; PFAT's preservative free artificial tears; NSC nuclear sclerotic cataract; PSC posterior subcapsular cataract; ERM epi-retinal membrane; PVD  posterior vitreous detachment; RD retinal detachment; DM diabetes mellitus; DR diabetic retinopathy; NPDR non-proliferative diabetic retinopathy; PDR proliferative diabetic retinopathy; CSME clinically significant macular edema; DME diabetic macular edema; dbh dot blot hemorrhages; CWS cotton wool spot; POAG primary open angle glaucoma; C/D cup-to-disc ratio; HVF humphrey visual field; GVF goldmann visual field; OCT optical coherence tomography; IOP intraocular pressure; BRVO Branch retinal vein occlusion; CRVO central retinal vein occlusion; CRAO central retinal artery occlusion; BRAO branch retinal artery occlusion; RT retinal tear; SB scleral buckle; PPV pars plana vitrectomy; VH Vitreous hemorrhage; PRP panretinal laser photocoagulation; IVK intravitreal kenalog ; VMT vitreomacular traction; MH Macular hole;  NVD neovascularization of the disc; NVE neovascularization elsewhere; AREDS age related  eye disease study; ARMD age related macular degeneration; POAG primary open angle glaucoma; EBMD epithelial/anterior basement membrane dystrophy; ACIOL anterior chamber intraocular lens; IOL intraocular lens; PCIOL posterior chamber intraocular lens; Phaco/IOL phacoemulsification with intraocular lens placement; PRK photorefractive keratectomy; LASIK laser assisted in situ keratomileusis; HTN hypertension; DM diabetes mellitus; COPD chronic obstructive pulmonary disease

## 2024-07-09 ENCOUNTER — Encounter (INDEPENDENT_AMBULATORY_CARE_PROVIDER_SITE_OTHER): Payer: Self-pay | Admitting: Ophthalmology

## 2024-07-09 ENCOUNTER — Ambulatory Visit (INDEPENDENT_AMBULATORY_CARE_PROVIDER_SITE_OTHER): Admitting: Ophthalmology

## 2024-07-09 DIAGNOSIS — H43392 Other vitreous opacities, left eye: Secondary | ICD-10-CM

## 2024-07-09 DIAGNOSIS — H25813 Combined forms of age-related cataract, bilateral: Secondary | ICD-10-CM

## 2024-07-09 DIAGNOSIS — H43812 Vitreous degeneration, left eye: Secondary | ICD-10-CM

## 2024-07-09 MED ORDER — PREDNISOLONE ACETATE 1 % OP SUSP: 1.0000 [drp] | Freq: Three times a day (TID) | OPHTHALMIC | 0 refills | Status: AC

## 2024-07-09 MED ORDER — BACITRACIN-POLYMYXIN B 500-10000 UNIT/GM OP OINT: TOPICAL_OINTMENT | Freq: Every day | OPHTHALMIC | 1 refills | Status: AC

## 2024-07-12 ENCOUNTER — Encounter (INDEPENDENT_AMBULATORY_CARE_PROVIDER_SITE_OTHER): Payer: Self-pay | Admitting: Ophthalmology

## 2024-08-03 NOTE — Progress Notes (Signed)
 " [facility] - Clinic Note  08/10/2024   CHIEF COMPLAINT Patient presents for Retina Follow Up  HISTORY OF PRESENT ILLNESS: Bobby Gross is a 69 y.o. male who presents to the clinic today for:  HPI     Retina Follow Up   In left eye.  This started 4 weeks ago.  Duration of weeks.  Since onset it is stable.  I, the attending physician,  performed the HPI with the patient and updated documentation appropriately.        Comments   4 week retina follow up POV PVD OS pt is reporting OS more photo phobic he denies any flashes or floaters       Last edited by Valdemar Rogue, MD on 08/17/2024  1:57 AM.    Pt states no issues, not seeing any floaters. Some sensitivity to light OS.   Referring physician: Lari Elspeth BRAVO, MD [address] [place],  [place] [address]  HISTORICAL INFORMATION:  Selected notes from the MEDICAL RECORD NUMBER Referred by Dr. Juli for PVD w/ visually significant vitreous opacities LEE:  Ocular Hx- PMH-   CURRENT MEDICATIONS: Current Outpatient Medications (Ophthalmic Drugs)  Medication Sig   Naphazoline-Pheniramine (OPCON-A) 0.027-0.315 % SOLN Place 1 drop into both eyes daily as needed (Redness).   prednisoLONE  acetate (PRED FORTE ) 1 % ophthalmic suspension Place 1 drop into the left eye in the morning, at noon, and at bedtime. Begin taper-3x/day for 1 week, 2x/day for 1 week, once daily for 1 week then stop   No current facility-administered medications for this visit. (Ophthalmic Drugs)   Current Outpatient Medications (Other)  Medication Sig   cetirizine (ZYRTEC) 10 MG tablet Take 10 mg by mouth daily as needed for allergies.   cyanocobalamin  (VITAMIN B12) 1000 MCG/ML injection INJECT 1 ML INTRAMUSCULARLY  EVERY 30 DAYS (DISCARD 28 DAYS  AFTER FIRST USE)   doxazosin (CARDURA) 4 MG tablet Take 4 mg by mouth every evening.   levothyroxine (SYNTHROID) 100 MCG tablet Take 100 mcg by mouth daily before breakfast.   mesalamine  (LIALDA )  1.2 g EC tablet TAKE 4 TABLETS BY MOUTH DAILY (Patient taking differently: Take 1.2 g by mouth 2 (two) times daily.)   Multiple Vitamin (MULTIVITAMIN WITH MINERALS) TABS Take 1 tablet by mouth in the morning.   Omega-3 Fatty Acids (OMEGA-3 EPA FISH OIL PO) Take 1,000 mg by mouth daily with breakfast.   TURMERIC PO Take 2 capsules by mouth daily with supper.   No current facility-administered medications for this visit. (Other)   REVIEW OF SYSTEMS: ROS   Positive for: Gastrointestinal, Musculoskeletal, Eyes, Allergic/Imm Negative for: Constitutional, Neurological, Skin, Genitourinary, HENT, Endocrine, Cardiovascular, Respiratory, Psychiatric, Heme/Lymph Last edited by Resa Delon ORN, COT on 08/10/2024  9:56 AM.     ALLERGIES No Known Allergies  PAST MEDICAL HISTORY Past Medical History:  Diagnosis Date   Aortic valve sclerosis 03/09/2020   hx - managed by PCP   Arthritis    no meds   B12 deficiency    Following small bowel resection   Cancer (HCC)    skin - back of neck   Crohn's disease (HCC)    Involving small bowel   History of kidney stones    passed stones and surgery for 1 [place]   Hyperlipemia    Hypothyroidism    Past Surgical History:  Procedure Laterality Date   BIOPSY  01/31/2017   Procedure: BIOPSY;  Surgeon: Shaaron Lamar HERO, MD;  Location: [facility];  Service: Endoscopy;;  biopsy     BIOPSY  12/20/2021   Procedure: BIOPSY;  Surgeon: Shaaron Lamar HERO, MD;  Location: [facility];  Service: Endoscopy;;  biopsies of anastomosis(stricture)   CERVICAL SPINE SURGERY  11/07/2021   C5/6 disc replacement   COLONOSCOPY  10/03/2006   Normal rectum/Status post right hemicolectomy with a small bowel anastomosis,  fibrotic-appearing anastomosis consistent with Crohn's, status post  biopsy, but overall, things look really good today   COLONOSCOPY  01/28/2012   Dr. Waddell R hemicolectomy for ileocolonic crohn's disease. disease appears inactive at this time.   all Bx's benign.   COLONOSCOPY N/A 01/31/2017   Procedure: COLONOSCOPY;  Surgeon: Shaaron Lamar HERO, MD;  Location: [facility];  Service: Endoscopy;  Laterality: N/A;  7:30am   COLONOSCOPY WITH PROPOFOL  N/A 12/20/2021   Procedure: COLONOSCOPY WITH PROPOFOL ;  Surgeon: Shaaron Lamar HERO, MD;  Location: [facility];  Service: Endoscopy;  Laterality: N/A;  11:15am   DEXA scan  09/2010   LEFT HEART CATH AND CORONARY ANGIOGRAPHY N/A 04/10/2017   Procedure: LEFT HEART CATH AND CORONARY ANGIOGRAPHY;  Surgeon: Claudene Victory ORN, MD;  Location: [facility];  Service: Cardiovascular;  Laterality: N/A;   OPEN SURGICAL REPAIR OF GLUTEAL TENDON Left 05/10/2023   Procedure: Left hip open gluteus medius repair;  Surgeon: Sharl Selinda Dover, MD;  Location: [facility];  Service: Orthopedics;  Laterality: Left;   PARS PLANA VITRECTOMY W/ FOCAL ENDOLASER PHOTOCOAGULATION Left 07/02/2024   Procedure: PARS PLANA VITRECTOMY WITH 25 GAUGE WITH FOCAL ENDOLASER PHOTOCOAGULATION;  Surgeon: Valdemar Rogue, MD;  Location: [facility] OR;  Service: Ophthalmology;  Laterality: Left;   RIGHT FOOT SURGER  2009   PLANTAR FASCITIS   RIGHT SHOULDER SURGERY     RIGHT WRIST SURGERY     SMALL INTESTINE SURGERY  1988   AT [facility]   TONSILLECTOMY     FAMILY HISTORY Family History  Problem Relation Age of Onset   Colon cancer Other    SOCIAL HISTORY Social History   Tobacco Use   Smoking status: Never   Smokeless tobacco: Never   Tobacco comments:    Never smoker  Vaping Use   Vaping status: Never Used  Substance Use Topics   Alcohol  use: No   Drug use: No       OPHTHALMIC EXAM:  Base Eye Exam     Visual Acuity (Snellen - Linear)       Right Left   Dist cc 20/20 20/25 -2         Tonometry (Tonopen, 10:01 AM)       Right Left   Pressure 17 22  1  gtts OS brom/dorz         Pupils       Pupils Dark Light Shape React APD   Right PERRL 3 2 Round Brisk None   Left PERRL 3 2 Round Brisk None          Visual Fields       Left Right    Full Full         Extraocular Movement       Right Left    Full, Ortho Full, Ortho         Neuro/Psych     Oriented x3: Yes   Mood/Affect: Normal         Dilation     Both eyes: 2.5% Phenylephrine  @ 10:01 AM           Slit  Lamp and Fundus Exam     External Exam       Right Left   External Normal Normal         Slit Lamp Exam       Right Left   Lids/Lashes Dermatochalasis - upper lid, Meibomian gland dysfunction Dermatochalasis - upper lid, Meibomian gland dysfunction   Conjunctiva/Sclera White and quiet Mild nasal and temporal pinguecula, sub conj heme improving, sutures intact   Cornea 4 cut RK, mild arcus, nasal and temporal LRI Trace fine PEE inferiorly, Mild tear film debris   Anterior Chamber deep and clear deep and clear   Iris Round and dilated Round and dilated   Lens 2+ Nuclear sclerosis, 2+ Cortical cataract 2+ Nuclear sclerosis w/ early brunescence, 2+ Cortical cataract   Anterior Vitreous mild syneresis Post vitrectomy, stable improvement in vit opacities         Fundus Exam       Right Left   Disc Pink and Sharp Pink and Sharp   C/D Ratio 0.55 0.5   Macula Flat, Good foveal reflex, fine drusen, RPE mottling, No heme or edema Flat, Good foveal reflex, fine Drusen, RPE mottling, No heme or edema   Vessels mild attenuation, mild tortuosity attenuated, mild tortuosity   Periphery Attached, reticular degeneration, No heme Attached; good 360 peripheral laser changes, no RT/RD           Refraction     Wearing Rx       Sphere Cylinder Axis   Right +0.75 +0.50 035   Left -1.50 +2.25 002    Type: SVL           IMAGING AND PROCEDURES  Imaging and Procedures for 08/10/2024  OCT, Retina - OU - Both Eyes       Right Eye Quality was good. Central Foveal Thickness: 294. Progression has been stable. Findings include normal foveal contour, no IRF, no SRF, retinal drusen (Partial PVD).   Left  Eye Quality was good. Central Foveal Thickness: 321. Progression has been stable. Findings include normal foveal contour, no IRF, no SRF, retinal drusen (Stable improvement in vit opacities; stable fine drusen).   Notes *Images captured and stored on drive  Diagnosis / Impression:  NFP, no IRF/SRF OU OS: Stable improvement in vit opacities; stable fine drusen  Clinical management:  See below  Abbreviations: NFP - Normal foveal profile. CME - cystoid macular edema. PED - pigment epithelial detachment. IRF - intraretinal fluid. SRF - subretinal fluid. EZ - ellipsoid zone. ERM - epiretinal membrane. ORA - outer retinal atrophy. ORT - outer retinal tubulation. SRHM - subretinal hyper-reflective material. IRHM - intraretinal hyper-reflective material           ASSESSMENT/PLAN:   ICD-10-CM   1. Posterior vitreous detachment of left eye  H43.812 OCT, Retina - OU - Both Eyes    2. Vitreous opacities of left eye  H43.392     3. Combined forms of age-related cataract of both eyes  H25.813       1,2. PVD OS -- visually significant vitreous opacities - onset in February 2025 -- persistent prominent symptomatic floater x9 mos without significant improvement  - pt reported significant impact on activities of daily living    - s/p PPV/OS -12.04.25             - doing well  - exam and OCT show stable improvement in vitreous opacities  - BCVA OS - 20/25 - ?cat progression - PF taper completed 1.3.26 -  Not using any gtts at this tim             - tylenol /ibuprofen for pain   - f/u 3 months POV, DFE, OCT  3. Mixed Cataract OU - The symptoms of cataract, surgical options, and treatments and risks were discussed with patient. - discussed diagnosis and progression - monitor  Ophthalmic Meds Ordered this visit:  No orders of the defined types were placed in this encounter.    Return in about 3 months (around 11/08/2024) for POV PVD OS, DFE, OCT.  There are no Patient Instructions on file  for this visit.  This document serves as a record of services personally performed by [name] Hans, MD, PhD. It was created on their behalf by Paulina Jamse Gay an ophthalmic technician. The creation of this record is the provider's dictation and/or activities during the visit.   Electronically signed by: Alana D Fowler  08/17/24  1:58 AM   This document serves as a record of services personally performed by [name] Hans, MD, PhD. It was created on their behalf by Wanda GEANNIE Keens, COT an ophthalmic technician. The creation of this record is the provider's dictation and/or activities during the visit.    Electronically signed by:  Wanda GEANNIE Keens, COT  08/17/24 1:58 AM  This document serves as a record of services personally performed by [name] Hans, MD, PhD. It was created on their behalf by Almetta Pesa, an ophthalmic technician. The creation of this record is the provider's dictation and/or activities during the visit.    Electronically signed by: Almetta Pesa, OA, 08/17/24  1:58 AM  [name] Hans, M.D., Ph.D. Diseases & Surgery of the Retina and Vitreous [facility]  I have reviewed the above documentation for accuracy and completeness, and I agree with the above. [name] Hans, M.D., Ph.D. 08/17/24 2:01 AM   Abbreviations: M myopia (nearsighted); A astigmatism; H hyperopia (farsighted); P presbyopia; Mrx spectacle prescription;  CTL contact lenses; OD right eye; OS left eye; OU both eyes  XT exotropia; ET esotropia; PEK punctate epithelial keratitis; PEE punctate epithelial erosions; DES dry eye syndrome; MGD meibomian gland dysfunction; ATs artificial tears; PFAT's preservative free artificial tears; NSC nuclear sclerotic cataract; PSC posterior subcapsular cataract; ERM epi-retinal membrane; PVD posterior vitreous detachment; RD retinal detachment; DM diabetes mellitus; DR diabetic retinopathy; NPDR non-proliferative diabetic  retinopathy; PDR proliferative diabetic retinopathy; CSME clinically significant macular edema; DME diabetic macular edema; dbh dot blot hemorrhages; CWS cotton wool spot; POAG primary open angle glaucoma; C/D cup-to-disc ratio; HVF humphrey visual field; GVF goldmann visual field; OCT optical coherence tomography; IOP intraocular pressure; BRVO Branch retinal vein occlusion; CRVO central retinal vein occlusion; CRAO central retinal artery occlusion; BRAO branch retinal artery occlusion; RT retinal tear; SB scleral buckle; PPV pars plana vitrectomy; VH Vitreous hemorrhage; PRP panretinal laser photocoagulation; IVK intravitreal kenalog ; VMT vitreomacular traction; MH Macular hole;  NVD neovascularization of the disc; NVE neovascularization elsewhere; AREDS age related eye disease study; ARMD age related macular degeneration; POAG primary open angle glaucoma; EBMD epithelial/anterior basement membrane dystrophy; ACIOL anterior chamber intraocular lens; IOL intraocular lens; PCIOL posterior chamber intraocular lens; Phaco/IOL phacoemulsification with intraocular lens placement; PRK photorefractive keratectomy; LASIK laser assisted in situ keratomileusis; HTN hypertension; DM diabetes mellitus; COPD chronic obstructive pulmonary disease  "

## 2024-08-10 ENCOUNTER — Encounter (INDEPENDENT_AMBULATORY_CARE_PROVIDER_SITE_OTHER): Payer: Self-pay | Admitting: Ophthalmology

## 2024-08-10 ENCOUNTER — Ambulatory Visit (INDEPENDENT_AMBULATORY_CARE_PROVIDER_SITE_OTHER): Admitting: Ophthalmology

## 2024-08-10 DIAGNOSIS — H25813 Combined forms of age-related cataract, bilateral: Secondary | ICD-10-CM

## 2024-08-10 DIAGNOSIS — H43812 Vitreous degeneration, left eye: Secondary | ICD-10-CM | POA: Diagnosis not present

## 2024-08-10 DIAGNOSIS — H43392 Other vitreous opacities, left eye: Secondary | ICD-10-CM

## 2024-08-17 ENCOUNTER — Encounter (INDEPENDENT_AMBULATORY_CARE_PROVIDER_SITE_OTHER): Payer: Self-pay | Admitting: Ophthalmology

## 2024-11-16 ENCOUNTER — Encounter (INDEPENDENT_AMBULATORY_CARE_PROVIDER_SITE_OTHER): Admitting: Ophthalmology
